# Patient Record
Sex: Male | Born: 1960 | Race: White | Hispanic: No | Marital: Married | Smoking: Current every day smoker
Health system: Southern US, Community
[De-identification: ages and names within clinical notes are randomized; demographics above are authoritative.]

## PROBLEM LIST (undated history)

## (undated) DIAGNOSIS — F419 Anxiety disorder, unspecified: Secondary | ICD-10-CM

## (undated) DIAGNOSIS — Z86010 Personal history of colonic polyps: Secondary | ICD-10-CM

## (undated) DIAGNOSIS — I10 Essential (primary) hypertension: Secondary | ICD-10-CM

## (undated) DIAGNOSIS — E785 Hyperlipidemia, unspecified: Secondary | ICD-10-CM

## (undated) HISTORY — DX: Personal history of colonic polyps: Z86.010

## (undated) HISTORY — PX: APPENDECTOMY: SHX54

## (undated) HISTORY — DX: Essential (primary) hypertension: I10

## (undated) HISTORY — DX: Hyperlipidemia, unspecified: E78.5

## (undated) HISTORY — DX: Anxiety disorder, unspecified: F41.9

## (undated) HISTORY — PX: VASECTOMY: SHX75

---

## 2009-01-26 ENCOUNTER — Ambulatory Visit: Payer: Self-pay | Admitting: Family Medicine

## 2009-01-26 DIAGNOSIS — I10 Essential (primary) hypertension: Secondary | ICD-10-CM | POA: Insufficient documentation

## 2009-01-26 DIAGNOSIS — E785 Hyperlipidemia, unspecified: Secondary | ICD-10-CM | POA: Insufficient documentation

## 2009-01-26 DIAGNOSIS — F17200 Nicotine dependence, unspecified, uncomplicated: Secondary | ICD-10-CM | POA: Insufficient documentation

## 2009-01-27 ENCOUNTER — Encounter (INDEPENDENT_AMBULATORY_CARE_PROVIDER_SITE_OTHER): Payer: Self-pay | Admitting: *Deleted

## 2009-01-27 LAB — CONVERTED CEMR LAB
ALT: 15 units/L (ref 0–53)
AST: 17 units/L (ref 0–37)
Albumin: 4.4 g/dL (ref 3.5–5.2)
Alkaline Phosphatase: 43 units/L (ref 39–117)
BUN: 20 mg/dL (ref 6–23)
Bilirubin, Direct: 0 mg/dL (ref 0.0–0.3)
CO2: 26 meq/L (ref 19–32)
Calcium: 9.8 mg/dL (ref 8.4–10.5)
Chloride: 107 meq/L (ref 96–112)
Cholesterol: 168 mg/dL (ref 0–200)
Creatinine, Ser: 1.3 mg/dL (ref 0.4–1.5)
Direct LDL: 127.3 mg/dL
GFR calc non Af Amer: 62.14 mL/min (ref >60)
Glucose, Bld: 98 mg/dL (ref 70–99)
HDL: 28.9 mg/dL — ABNORMAL LOW (ref >39.00)
Potassium: 4.3 meq/L (ref 3.5–5.1)
Sodium: 139 meq/L (ref 135–145)
Total Bilirubin: 0.9 mg/dL (ref 0.3–1.2)
Total CHOL/HDL Ratio: 6
Total Protein: 7.5 g/dL (ref 6.0–8.3)
Triglycerides: 213 mg/dL — ABNORMAL HIGH (ref 0.0–149.0)
VLDL: 42.6 mg/dL — ABNORMAL HIGH (ref 0.0–40.0)

## 2009-01-28 ENCOUNTER — Telehealth (INDEPENDENT_AMBULATORY_CARE_PROVIDER_SITE_OTHER): Payer: Self-pay | Admitting: *Deleted

## 2009-09-02 ENCOUNTER — Telehealth (INDEPENDENT_AMBULATORY_CARE_PROVIDER_SITE_OTHER): Payer: Self-pay | Admitting: *Deleted

## 2009-09-10 ENCOUNTER — Ambulatory Visit: Payer: Self-pay | Admitting: Family Medicine

## 2009-09-10 DIAGNOSIS — L723 Sebaceous cyst: Secondary | ICD-10-CM

## 2009-09-13 LAB — CONVERTED CEMR LAB
ALT: 18 units/L (ref 0–53)
AST: 21 units/L (ref 0–37)
Albumin: 4.6 g/dL (ref 3.5–5.2)
Alkaline Phosphatase: 48 units/L (ref 39–117)
BUN: 16 mg/dL (ref 6–23)
Basophils Absolute: 0.1 10*3/uL (ref 0.0–0.1)
Basophils Relative: 0.6 % (ref 0.0–3.0)
Bilirubin, Direct: 0.1 mg/dL (ref 0.0–0.3)
CO2: 25 meq/L (ref 19–32)
Calcium: 9.6 mg/dL (ref 8.4–10.5)
Chloride: 106 meq/L (ref 96–112)
Cholesterol: 173 mg/dL (ref 0–200)
Creatinine, Ser: 1 mg/dL (ref 0.4–1.5)
Eosinophils Absolute: 0.3 10*3/uL (ref 0.0–0.7)
Eosinophils Relative: 2.9 % (ref 0.0–5.0)
GFR calc non Af Amer: 80.18 mL/min (ref >60)
Glucose, Bld: 105 mg/dL — ABNORMAL HIGH (ref 70–99)
HCT: 44 % (ref 39.0–52.0)
HDL: 34.1 mg/dL — ABNORMAL LOW (ref >39.00)
Hemoglobin: 15.3 g/dL (ref 13.0–17.0)
LDL Cholesterol: 104 mg/dL — ABNORMAL HIGH (ref 0–99)
Lymphocytes Relative: 32.3 % (ref 12.0–46.0)
Lymphs Abs: 3.4 10*3/uL (ref 0.7–4.0)
MCHC: 34.7 g/dL (ref 30.0–36.0)
MCV: 91.1 fL (ref 78.0–100.0)
Monocytes Absolute: 1 10*3/uL (ref 0.1–1.0)
Monocytes Relative: 9.8 % (ref 3.0–12.0)
Neutro Abs: 5.8 10*3/uL (ref 1.4–7.7)
Neutrophils Relative %: 54.4 % (ref 43.0–77.0)
PSA: 0.53 ng/mL (ref 0.10–4.00)
Platelets: 294 10*3/uL (ref 150.0–400.0)
Potassium: 4.8 meq/L (ref 3.5–5.1)
RBC: 4.83 M/uL (ref 4.22–5.81)
RDW: 13 % (ref 11.5–14.6)
Sodium: 141 meq/L (ref 135–145)
TSH: 1.86 microintl units/mL (ref 0.35–5.50)
Total Bilirubin: 0.9 mg/dL (ref 0.3–1.2)
Total CHOL/HDL Ratio: 5
Total Protein: 7.3 g/dL (ref 6.0–8.3)
Triglycerides: 174 mg/dL — ABNORMAL HIGH (ref 0.0–149.0)
VLDL: 34.8 mg/dL (ref 0.0–40.0)
WBC: 10.6 10*3/uL — ABNORMAL HIGH (ref 4.5–10.5)

## 2009-10-01 ENCOUNTER — Ambulatory Visit: Payer: Self-pay | Admitting: Family Medicine

## 2009-10-08 ENCOUNTER — Encounter (INDEPENDENT_AMBULATORY_CARE_PROVIDER_SITE_OTHER): Payer: Self-pay | Admitting: *Deleted

## 2009-10-12 ENCOUNTER — Ambulatory Visit: Payer: Self-pay | Admitting: Internal Medicine

## 2009-10-22 ENCOUNTER — Ambulatory Visit: Payer: Self-pay | Admitting: Internal Medicine

## 2009-10-22 DIAGNOSIS — Z860100 Personal history of colon polyps, unspecified: Secondary | ICD-10-CM | POA: Insufficient documentation

## 2009-10-22 LAB — HM COLONOSCOPY

## 2009-10-26 ENCOUNTER — Ambulatory Visit: Payer: Self-pay | Admitting: Family Medicine

## 2009-10-28 ENCOUNTER — Encounter: Payer: Self-pay | Admitting: Internal Medicine

## 2010-04-29 ENCOUNTER — Ambulatory Visit
Admission: RE | Admit: 2010-04-29 | Discharge: 2010-04-29 | Payer: Self-pay | Source: Home / Self Care | Attending: Family Medicine | Admitting: Family Medicine

## 2010-04-29 ENCOUNTER — Other Ambulatory Visit: Payer: Self-pay | Admitting: Family Medicine

## 2010-04-29 LAB — BASIC METABOLIC PANEL
BUN: 19 mg/dL (ref 6–23)
CO2: 26 mEq/L (ref 19–32)
Calcium: 9.7 mg/dL (ref 8.4–10.5)
Chloride: 105 mEq/L (ref 96–112)
Creatinine, Ser: 1.1 mg/dL (ref 0.4–1.5)
GFR: 78.24 mL/min (ref >60.00)
Glucose, Bld: 84 mg/dL (ref 70–99)
Potassium: 4.1 mEq/L (ref 3.5–5.1)
Sodium: 143 mEq/L (ref 135–145)

## 2010-04-29 LAB — HEPATIC FUNCTION PANEL
ALT: 18 U/L (ref 0–53)
AST: 22 U/L (ref 0–37)
Albumin: 4.4 g/dL (ref 3.5–5.2)
Alkaline Phosphatase: 51 U/L (ref 39–117)
Bilirubin, Direct: 0.1 mg/dL (ref 0.0–0.3)
Total Bilirubin: 0.8 mg/dL (ref 0.3–1.2)
Total Protein: 7.2 g/dL (ref 6.0–8.3)

## 2010-04-29 LAB — LIPID PANEL
Cholesterol: 193 mg/dL (ref 0–200)
HDL: 33.4 mg/dL — ABNORMAL LOW (ref >39.00)
LDL Cholesterol: 128 mg/dL — ABNORMAL HIGH (ref 0–99)
Total CHOL/HDL Ratio: 6
Triglycerides: 156 mg/dL — ABNORMAL HIGH (ref 0.0–149.0)
VLDL: 31.2 mg/dL (ref 0.0–40.0)

## 2010-05-24 NOTE — Assessment & Plan Note (Signed)
Summary: cpx/kdc   Vital Signs:  Patient profile:   50 year old male Height:      69.75 inches Weight:      243 pounds BMI:     35.24 Pulse rate:   68 / minute BP sitting:   114 / 78  (left arm)  Vitals Entered By: Doristine Devoid (Sep 10, 2009 8:18 AM) CC: CPX AND LABS    History of Present Illness: 50 yo man here today for CPE.    1) R lateral thigh parasthesia- sxs started 2 yrs ago, constant.  had birthmark removed from area at same time sxs started.  2) ? cyst on back of head- has been present for 10 yrs, slowly growing.  no drainage, no pain.  Preventive Screening-Counseling & Management  Alcohol-Tobacco     Alcohol drinks/day: <1     Smoking Status: current     Smoking Cessation Counseling: yes     Smoke Cessation Stage: contemplative     Packs/Day: 0.75  Caffeine-Diet-Exercise     Does Patient Exercise: yes     Type of exercise: stationary bike, weights      Sexual History:  currently monogamous.        Drug Use:  never.    Current Medications (verified): 1)  Fenofibrate 160 Mg Tabs (Fenofibrate) .... Take One Tablet Daily 2)  Pravastatin Sodium 80 Mg Tabs (Pravastatin Sodium) .... Take One Tablet Daily 3)  Prevacid 24hr 15 Mg Cpdr (Lansoprazole) .... Take One Tablet Daily 4)  Hydrochlorothiazide 25 Mg  Tabs (Hydrochlorothiazide) .... Take 1 Tab By Mouth Every Morning  Allergies (verified): No Known Drug Allergies  Past History:  Past Medical History: Last updated: 01/26/2009 Hyperlipidemia Hypertension  Past Surgical History: Last updated: 01/26/2009 Appendectomy  Family History: Last updated: 01/26/2009 CAD-no HTN-no DM-paternal grandfather STROKE-no COLON CA-no PROSTATE CA-no  Social History: Last updated: 01/26/2009 married son (87) works in Education officer, environmental  [employer]       The patient complains of prolonged cough.  The patient denies anorexia, fever, weight loss, weight gain, vision loss, decreased hearing, hoarseness, chest  pain, syncope, dyspnea on exertion, peripheral edema, headaches, abdominal pain, melena, hematochezia, severe indigestion/heartburn, hematuria, suspicious skin lesions, depression, abnormal bleeding, enlarged lymph nodes, and testicular masses.    Physical Exam  General:  Overwt, well-developed,well-nourished,in no acute distress; alert,appropriate and cooperative throughout examination Head:  Normocephalic and atraumatic without obvious abnormalities. No apparent alopecia or balding.  small sebaceous cyst behind R ear Eyes:  No corneal or conjunctival inflammation noted. EOMI. Perrla. Funduscopic exam benign, without hemorrhages, exudates or papilledema. Vision grossly normal. Ears:  External ear exam shows no significant lesions or deformities.  Otoscopic examination reveals clear canals, tympanic membranes are intact bilaterally without bulging, retraction, inflammation or discharge. Hearing is grossly normal bilaterally. Nose:  External nasal examination shows no deformity or inflammation. Nasal mucosa are pink and moist without lesions or exudates. Mouth:  Oral mucosa and oropharynx without lesions or exudates.  Teeth in good repair. Neck:  No deformities, masses, or tenderness noted. Lungs:  Normal respiratory effort, chest expands symmetrically. Lungs are clear to auscultation, no crackles or wheezes. Heart:  Normal rate and regular rhythm. S1 and S2 normal without gallop, murmur, click, rub or other extra sounds. Abdomen:  soft, NT/ND, +BS Rectal:  No external abnormalities noted. Normal sphincter tone. No rectal masses or tenderness. Genitalia:  Testes bilaterally descended without nodularity, tenderness or masses. No scrotal masses or lesions. No penis lesions or urethral discharge.  Prostate:  Prostate gland firm and smooth, no enlargement, nodularity, tenderness, mass, asymmetry or induration. Msk:  No deformity or scoliosis noted of thoracic or lumbar spine.   Pulses:  +2 carotid,  radial, DP Extremities:  No clubbing, cyanosis, edema, or deformity noted with normal full range of motion of all joints.   Neurologic:  No cranial nerve deficits noted. Station and gait are normal. Plantar reflexes are down-going bilaterally. DTRs are symmetrical throughout. Sensory, motor and coordinative functions appear intact. Skin:  Intact without suspicious lesions or rashes Cervical Nodes:  No lymphadenopathy noted Inguinal Nodes:  No significant adenopathy Psych:  Cognition and judgment appear intact. Alert and cooperative with normal attention span and concentration. No apparent delusions, illusions, hallucinations   Impression & Recommendations:  Problem # 1:  PHYSICAL EXAMINATION (ICD-V70.0) Assessment New pt's PE WNL.  baseline EKG obtained.  pt now needs routine colonoscopy.  check labs.  anticipatory guidance provided. Orders: TLB-CBC Platelet - w/Differential (85025-CBCD) TLB-TSH (Thyroid Stimulating Hormone) (84443-TSH) TLB-PSA (Prostate Specific Antigen) (84153-PSA) Gastroenterology Referral (GI)  Problem # 2:  TOBACCO USE (ICD-305.1) Assessment: Unchanged encouraged pt to quit.  get baseline CXR Orders: T-2 View CXR (71020TC)  Problem # 3:  HYPERTENSION (ICD-401.9) Assessment: Unchanged BP excellent today.  Lisinopril is causing cough- pt would prefer to change meds.  given control will attempt to manage BP on just HCTZ.  pt in agreement. The following medications were removed from the medication list:    Lisinopril-hydrochlorothiazide 20-25 Mg Tabs (Lisinopril-hydrochlorothiazide) .Marland Kitchen... 1 by mouth once daily His updated medication list for this problem includes:    Hydrochlorothiazide 25 Mg Tabs (Hydrochlorothiazide) .Marland Kitchen... Take 1 tab by mouth every morning  Orders: TLB-BMP (Basic Metabolic Panel-BMET) (80048-METABOL)  Problem # 4:  HYPERLIPIDEMIA (ICD-272.4) Assessment: Unchanged due for labs today.  no problems tolerating meds. His updated medication list  for this problem includes:    Fenofibrate 160 Mg Tabs (Fenofibrate) .Marland Kitchen... Take one tablet daily    Pravastatin Sodium 80 Mg Tabs (Pravastatin sodium) .Marland Kitchen... Take one tablet daily  Orders: Venipuncture ([address]) TLB-Lipid Panel (80061-LIPID) TLB-Hepatic/Liver Function Pnl (80076-HEPATIC)  Problem # 5:  SEBACEOUS CYST, SCALP (ICD-706.2) Assessment: New no pain, not infected.  will continue to monitor.  Complete Medication List: 1)  Fenofibrate 160 Mg Tabs (Fenofibrate) .... Take one tablet daily 2)  Pravastatin Sodium 80 Mg Tabs (Pravastatin sodium) .... Take one tablet daily 3)  Prevacid 24hr 15 Mg Cpdr (Lansoprazole) .... Take one tablet daily 4)  Hydrochlorothiazide 25 Mg Tabs (Hydrochlorothiazide) .... Take 1 tab by mouth every morning  Patient Instructions: 1)  Schedule a nurse visit in 3-4 weeks to recheck BP 2)  Please schedule a follow-up appointment in 6 months to recheck blood pressure and cholesterol.  3)  Take the hydrochlorothiazide as directed- once daily 4)  Keep up the good work on diet and exercise 5)  STOP SMOKING! 6)  Go get your chest xray at [address] at your convenience 7)  Someone will call you with your GI appt 8)  We can monitor your cyst- if it grows, becomes painful or drains- let me know 9)  Have a great summer!! Prescriptions: HYDROCHLOROTHIAZIDE 25 MG  TABS (HYDROCHLOROTHIAZIDE) Take 1 tab by mouth every morning  #30 x 3   Entered and Authorized by:   Neena Rhymes MD   Signed by:   Neena Rhymes MD on 09/10/2009   Method used:   [facility]        [facility]  [place] (retail[facility]       [address]       [place], [place]  [address]       Ph: [phone]       Fax: [phone]   RxID:   [id number]   Appended Document: cpx/kdc    Clinical Lists Changes  Orders: Added new Service order of EKG w/ Interpretation (93000) - Signed

## 2010-05-24 NOTE — Assessment & Plan Note (Signed)
Summary: bp check//kn   Vital Signs:  Patient profile:   50 year old male Weight:      245 pounds Pulse rate:   84 / minute BP sitting:   118 / 72  (left arm)  Vitals Entered By: Doristine Devoid (October 26, 2009 10:54 AM) CC: bp check  Comments patient's bp has been average of 120/80's    Allergies: No Known Drug Allergies   Complete Medication List: 1)  Fenofibrate 160 Mg Tabs (Fenofibrate) .... Take one tablet daily 2)  Pravastatin Sodium 80 Mg Tabs (Pravastatin sodium) .... Take one tablet daily 3)  Prevacid 24hr 15 Mg Cpdr (Lansoprazole) .... Take one tablet daily 4)  Hydrochlorothiazide 25 Mg Tabs (Hydrochlorothiazide) .... Take 1 tab by mouth every morning 5)  Amlodipine Besylate 5 Mg Tabs (Amlodipine besylate) .Marland Kitchen.. 1 by mouth every day 6)  Moviprep 100 Gm Solr (Peg-kcl-nacl-nasulf-na asc-c) .... As per prep instructions.

## 2010-05-24 NOTE — Progress Notes (Signed)
Summary: Problems with RX  Phone Note Call from Patient Call back at Home Phone [phone]   Caller: Patient Call For: Walk-In Reason for Call: Refill Medication Summary of Call: Patient came in to ask about his lisnopril rx. He said that the pharmacy kept telling him that we were denying because we still thought he was on the diovan. Patient said he tryed to Diovan for a couple of days and didnt like it. He went back to the lisnopril and is dealing with the cough and using cough drops. The pharmacy has been refilling off of his latest rx refill until it ran out. He now needed a new rx for the lisonopril. I spoke with Alida and she sent the new rx over to his pharmacy. He has a CPX scheduled with Dr. Beverely Low on 5.20.11 and can discuss the medications further then. Initial call taken by: Harold Barban,  Sep 02, 2009 11:29 AM    New/Updated Medications: LISINOPRIL-HYDROCHLOROTHIAZIDE 20-25 MG TABS (LISINOPRIL-HYDROCHLOROTHIAZIDE) 1 by mouth once daily Prescriptions: LISINOPRIL-HYDROCHLOROTHIAZIDE 20-25 MG TABS (LISINOPRIL-HYDROCHLOROTHIAZIDE) 1 by mouth once daily  #30 x 0   Entered by:   Kandice Hams   Authorized by:   Neena Rhymes MD   Signed by:   Kandice Hams on 09/02/2009   Method used:   Faxed to ...       [facility] [phone]* (retail)       [address]       [place], [place]  [address]       Ph: [phone]       Fax: [phone]   RxID:   [id number]

## 2010-05-24 NOTE — Procedures (Signed)
Summary: Colonoscopy  Patient: [name] Note: All result statuses are Final unless otherwise noted.  Tests: (1) Colonoscopy (COL)   COL Colonoscopy           DONE     [facility]     [address]     [place], [place]  [address]           COLONOSCOPY PROCEDURE REPORT           PATIENT:  Thomas Proctor, Thomas Proctor  MR#:  [id number]     BIRTHDATE:  07-18-1960, 48 yrs. old  GENDER:  male     ENDOSCOPIST:  Iva Boop, MD, [facility]     REF. BY:  Helane Rima. Beverely Low, M.D.     PROCEDURE DATE:  10/22/2009     PROCEDURE:  Colonoscopy with biopsy and snare polypectomy     ASA CLASS:  Class II     INDICATIONS:  Routine Risk Screening     MEDICATIONS:   Fentanyl 75 mcg IV, Versed 8 mg IV           DESCRIPTION OF PROCEDURE:   After the risks benefits and     alternatives of the procedure were thoroughly explained, informed     consent was obtained.  Digital rectal exam was performed and     revealed no abnormalities and normal prostate.   The LB CF-[id number]     [id number] endoscope was introduced through the anus and advanced to     the cecum, which was identified by both the appendix and ileocecal     valve, without limitations.  The quality of the prep was     excellent, using MoviPrep.  The instrument was then slowly     withdrawn as the colon was fully examined.     Insertion: 2:58 minutes Withdrawal: 13:35 minutes     <<PROCEDUREIMAGES>>           FINDINGS:  Three polyps were found. They were diminutive. 2mm     (transverse), 4mm (transverse) and 5 mm (rectal) polyps The 2mm     polyp was removed using cold biopsy forceps. The other polyps were     snared without cautery. Retrieval was successful. Mild     diverticulosis was found in the sigmoid colon.  This was otherwise     a normal examination of the colon.   Retroflexed views in the     rectum revealed no abnormalities.    The scope was then withdrawn     from the patient and the procedure completed.           COMPLICATIONS:   None     ENDOSCOPIC IMPRESSION:     1) Three polyps removed, maximum-size 5 mm.     2) Mild diverticulosis in the sigmoid colon     3) Normal colonoscopy otherwise with excellent prep           REPEAT EXAM:  In for Colonoscopy, pending biopsy results.           Iva Boop, MD, [facility]           CC:  Sheliah Hatch, MD     The Patient           n.     Rosalie Doctor:   Iva Boop at 10/22/2009 12:14 PM           Velora Mediate, [id number]  Note: An exclamation mark (!) indicates a result that was not dispersed into  the flowsheet. Document Creation Date: 10/22/2009 12:15 PM _______________________________________________________________________  (1) Order result status: Final Collection or observation date-time: 10/22/2009 12:05 Requested date-time:  Receipt date-time:  Reported date-time:  Referring Physician:   Ordering Physician: Stan Head [phone]) Specimen Source:  Source: Launa Grill Order Number: [id number] Lab site:   Appended Document: Colonoscopy     Procedures Next Due Date:    Colonoscopy: 10/2012

## 2010-05-24 NOTE — Miscellaneous (Signed)
Summary: previsit/rm  Clinical Lists Changes  Medications: Added new medication of MOVIPREP 100 GM  SOLR (PEG-KCL-NACL-NASULF-NA ASC-C) As per prep instructions. - Signed Rx of MOVIPREP 100 GM  SOLR (PEG-KCL-NACL-NASULF-NA ASC-C) As per prep instructions.;  #1 x 0;  Signed;  Entered by: Sherren Kerns RN;  Authorized by: Iva Boop MD, [facility];  Method used: [facility] to [facility] [phone]*, [address], [place], [place], [place]  [address], Ph: [phone], Fax: [phone] Observations: Added new observation of ALLERGY REV: Done (10/12/2009 10:47)    Prescriptions: MOVIPREP 100 GM  SOLR (PEG-KCL-NACL-NASULF-NA ASC-C) As per prep instructions.  #1 x 0   Entered by:   Sherren Kerns RN   Authorized by:   Iva Boop MD, [facility]   Signed by:   Sherren Kerns RN on 10/12/2009   Method used:   Electronically to        [facility] [phone]* (retail)       [address]       [place], [place]  [address]       Ph: [phone]       Fax: [phone]   RxID:   [id number]

## 2010-05-24 NOTE — Assessment & Plan Note (Signed)
Summary: bp check/cbs   Vital Signs:  Patient profile:   50 year old male Weight:      248 pounds Pulse rate:   88 / minute BP sitting:   132 / 90  (left arm)  Vitals Entered By: Doristine Devoid (October 01, 2009 9:14 AM) CC: bp check    History of Present Illness: 50 yo man here today to f/u BP since stopping Lisinopril.  BPs at home in 130-140s since stopping med.  no CP, SOB, HAs, visual changes, edema.  stopped coughing when meds stopped.  Preventive Screening-Counseling & Management  Alcohol-Tobacco     Smoking Status: current     Smoking Cessation Counseling: yes     Smoke Cessation Stage: contemplative  Allergies (verified): No Known Drug Allergies  Past History:  Past Medical History: Last updated: 01/26/2009 Hyperlipidemia Hypertension  Review of Systems      See HPI  Physical Exam  General:  Overwt, well-developed,well-nourished,in no acute distress; alert,appropriate and cooperative throughout examination Neck:  No deformities, masses, or tenderness noted. Lungs:  Normal respiratory effort, chest expands symmetrically. Lungs are clear to auscultation, no crackles or wheezes. Heart:  Normal rate and regular rhythm. S1 and S2 normal without gallop, murmur, click, rub or other extra sounds. Pulses:  +2 carotid, radial, DP Extremities:  No clubbing, cyanosis, edema, or deformity noted with normal full range of motion of all joints.     Impression & Recommendations:  Problem # 1:  HYPERTENSION (ICD-401.9) Assessment Unchanged BP not at goal on just HCTZ.  add Amlodipine.  recheck BP in 3-4 weeks. His updated medication list for this problem includes:    Hydrochlorothiazide 25 Mg Tabs (Hydrochlorothiazide) .Marland Kitchen... Take 1 tab by mouth every morning    Amlodipine Besylate 5 Mg Tabs (Amlodipine besylate) .Marland Kitchen... 1 by mouth every day  Complete Medication List: 1)  Fenofibrate 160 Mg Tabs (Fenofibrate) .... Take one tablet daily 2)  Pravastatin Sodium 80 Mg Tabs  (Pravastatin sodium) .... Take one tablet daily 3)  Prevacid 24hr 15 Mg Cpdr (Lansoprazole) .... Take one tablet daily 4)  Hydrochlorothiazide 25 Mg Tabs (Hydrochlorothiazide) .... Take 1 tab by mouth every morning 5)  Amlodipine Besylate 5 Mg Tabs (Amlodipine besylate) .Marland Kitchen.. 1 by mouth every day  Patient Instructions: 1)  Please schedule a follow-up appointment in 3-4 weeks for a nurse visit to check BP. 2)  Start the Amlodipine daily 3)  Call with any questions or concerns 4)  Have a great summer!  Prescriptions: AMLODIPINE BESYLATE 5 MG  TABS (AMLODIPINE BESYLATE) 1 by mouth every day  #30 x 3   Entered and Authorized by:   Neena Rhymes MD   Signed by:   Neena Rhymes MD on 10/01/2009   Method used:   Electronically to        [facility] [phone]* (retail)       [address]       [place], [place]  [address]       Ph: [phone]       Fax: [phone]   RxID:   [id number]

## 2010-05-24 NOTE — Letter (Signed)
Summary: Patient Notice- Polyp Results  [facility]  [address] [place], [place] [address]   Phone: [phone]  Fax: [phone]        October 28, 2009 MRN: [id number]    Thomas Proctor [address] [place], [place]  [address]    Dear [name],  The polyps removed from your colon were adenomatous. This means that they were pre-cancerous or that  they had the potential to change into cancer over time.   I recommend that you have a repeat colonoscopy in 3 years to determine if you have developed any new polyps over time. If you develop any new rectal bleeding, abdominal pain or significant bowel habit changes, please contact us before then.  Please call us if you are having persistent problems or have questions about your condition that have not been fully answered at this time.  Sincerely,  Iva Boop MD, [facility]  This letter has been electronically signed by your physician.  Appended Document: Patient Notice- Polyp Results letter mailed

## 2010-05-24 NOTE — Letter (Signed)
Summary: [facility] Instructions  [facility]  [address] [place], [place] [address]   Phone: [phone]  Fax: [phone]       Thomas Proctor    06-19-1960    MRN: [id number]        Procedure Day Dorna Bloom:  Farrell Ours  10/22/09     Arrival Time: 10:30am     Procedure Time: 11:30am     Location of Procedure:                    _X _  [facility] (4th Floor)                        PREPARATION FOR COLONOSCOPY WITH MOVIPREP   Starting 5 days prior to your procedure  SUNDAY 06/26 do not eat nuts, seeds, popcorn, corn, beans, peas,  salads, or any raw vegetables.  Do not take any fiber supplements (e.g. Metamucil, Citrucel, and Benefiber).  THE DAY BEFORE YOUR PROCEDURE         DATE: THURSDAY  06/30  1.  Drink clear liquids the entire day-NO SOLID FOOD  2.  Do not drink anything colored red or purple.  Avoid juices with pulp.  No orange juice.  3.  Drink at least 64 oz. (8 glasses) of fluid/clear liquids during the day to prevent dehydration and help the prep work efficiently.  CLEAR LIQUIDS INCLUDE: Water Jello Ice Popsicles Tea (sugar ok, no milk/cream) Powdered fruit flavored drinks Coffee (sugar ok, no milk/cream) Gatorade Juice: apple, white grape, white cranberry  Lemonade Clear bullion, consomm, broth Carbonated beverages (any kind) Strained chicken noodle soup Hard Candy                             4.  In the morning, mix first dose of MoviPrep solution:    Empty 1 Pouch A and 1 Pouch B into the disposable container    Add lukewarm drinking water to the top line of the container. Mix to dissolve    Refrigerate (mixed solution should be used within 24 hrs)  5.  Begin drinking the prep at 5:00 p.m. The MoviPrep container is divided by 4 marks.   Every 15 minutes drink the solution down to the next mark (approximately 8 oz) until the full liter is complete.   6.  Follow completed prep with 16 oz of clear liquid of your choice  (Nothing red or purple).  Continue to drink clear liquids until bedtime.  7.  Before going to bed, mix second dose of MoviPrep solution:    Empty 1 Pouch A and 1 Pouch B into the disposable container    Add lukewarm drinking water to the top line of the container. Mix to dissolve    Refrigerate  THE DAY OF YOUR PROCEDURE      DATE:  FRIDAY  07/01  Beginning at  6:30 a.m. (5 hours before procedure):         1. Every 15 minutes, drink the solution down to the next mark (approx 8 oz) until the full liter is complete.  2. Follow completed prep with 16 oz. of clear liquid of your choice.    3. You may drink clear liquids until 9:30am (2 HOURS BEFORE PROCEDURE).   MEDICATION INSTRUCTIONS  Unless otherwise instructed, you should take regular prescription medications with a small sip of water   as early as possible the  morning of your procedure.   Additional medication instructions:  Hold HCTZ pill morning of procedure.         OTHER INSTRUCTIONS  You will need a responsible adult at least 52 years of age to accompany you and drive you home.   This person must remain in the waiting room during your procedure.  Wear loose fitting clothing that is easily removed.  Leave jewelry and other valuables at home.  However, you may wish to bring a book to read or  an iPod/MP3 player to listen to music as you wait for your procedure to start.  Remove all body piercing jewelry and leave at home.  Total time from sign-in until discharge is approximately 2-3 hours.  You should go home directly after your procedure and rest.  You can resume normal activities the  day after your procedure.  The day of your procedure you should not:   Drive   Make legal decisions   Operate machinery   Drink alcohol   Return to work  You will receive specific instructions about eating, activities and medications before you leave.    The above instructions have been reviewed and explained to me  by  Sherren Kerns RN  October 12, 2009 11:38 AM      I fully understand and can verbalize these instructions _____________________________ Date _________

## 2010-05-26 NOTE — Assessment & Plan Note (Signed)
Summary: bp check& lab// cbs   Vital Signs:  Patient profile:   50 year old male Weight:      241 pounds BMI:     34.95 Pulse rate:   95 / minute BP sitting:   110 / 80  (left arm)  Vitals Entered By: Doristine Devoid CMA (April 29, 2010 9:43 AM) CC: bp f/u and labs   History of Present Illness: 50 yo man here today for  1) HTN- BP is excellent today.  no CP, SOB, HAs, visual changes, edema.  on amlodipine and HCTZ.  2) Hyperlipidemia- on pravastatin and fenofibrate.  no N/V, abd pain, myalgias.  3) tobacco abuse- pt had quit smoking for 7 days before his temper became a problem.  now smoking 3/4 pack daily.  wants to quit.  wife and son also smoke.  Preventive Screening-Counseling & Management  Alcohol-Tobacco     Smoking Status: current     Smoking Cessation Counseling: yes     Packs/Day: 0.75  Current Medications (verified): 1)  Fenofibrate 160 Mg Tabs (Fenofibrate) .... Take One Tablet Daily 2)  Pravastatin Sodium 80 Mg Tabs (Pravastatin Sodium) .... Take One Tablet Daily 3)  Prevacid 24hr 15 Mg Cpdr (Lansoprazole) .... Take One Tablet Daily 4)  Hydrochlorothiazide 25 Mg  Tabs (Hydrochlorothiazide) .... Take 1 Tab By Mouth Every Morning 5)  Amlodipine Besylate 5 Mg  Tabs (Amlodipine Besylate) .Marland Kitchen.. 1 By Mouth Every Day  Allergies (verified): No Known Drug Allergies  Past History:  Past medical, surgical, family and social histories (including risk factors) reviewed, and no changes noted (except as noted below).  Past Medical History: Reviewed history from 01/26/2009 and no changes required. Hyperlipidemia Hypertension  Past Surgical History: Reviewed history from 01/26/2009 and no changes required. Appendectomy  Family History: Reviewed history from 01/26/2009 and no changes required. CAD-no HTN-no DM-paternal grandfather STROKE-no COLON CA-no PROSTATE CA-no  Social History: Reviewed history from 01/26/2009 and no changes required. married son  (87) works in Education officer, environmental  Review of Systems      See HPI  Physical Exam  General:  Overwt, well-developed,well-nourished,in no acute distress; alert,appropriate and cooperative throughout examination Head:  Normocephalic and atraumatic without obvious abnormalities Neck:  No deformities, masses, or tenderness noted. Lungs:  Normal respiratory effort, chest expands symmetrically. Lungs are clear to auscultation, no crackles or wheezes. Heart:  Normal rate and regular rhythm. S1 and S2 normal without gallop, murmur, click, rub or other extra sounds. Abdomen:  soft, NT/ND, +BS Pulses:  +2 carotid, radial, DP Extremities:  No clubbing, cyanosis, edema, or deformity noted     Impression & Recommendations:  Problem # 1:  HYPERTENSION (ICD-401.9) Assessment Unchanged BP excellent today.  asymptomatic.  no changes. His updated medication list for this problem includes:    Hydrochlorothiazide 25 Mg Tabs (Hydrochlorothiazide) .Marland Kitchen... Take 1 tab by mouth every morning    Amlodipine Besylate 5 Mg Tabs (Amlodipine besylate) .Marland Kitchen... 1 by mouth every day  Orders: Venipuncture (04540) TLB-BMP (Basic Metabolic Panel-BMET) (80048-METABOL)  Problem # 2:  HYPERLIPIDEMIA (ICD-272.4) Assessment: Unchanged due for labs.  adjust meds as needed. His updated medication list for this problem includes:    Fenofibrate 160 Mg Tabs (Fenofibrate) .Marland Kitchen... Take one tablet daily    Pravastatin Sodium 80 Mg Tabs (Pravastatin sodium) .Marland Kitchen... Take one tablet daily  Orders: TLB-Lipid Panel (80061-LIPID) TLB-Hepatic/Liver Function Pnl (80076-HEPATIC)  Problem # 3:  TOBACCO USE (ICD-305.1) Assessment: Unchanged discussed different options for quitting smoking- patches, gum, lozenges, welbutrin.  pt  would like to try on his own by decreasing the # daily.  talked about strategies for limiting # of cigs/day, limiting places he allows himself to smoke, and putting $ spent on cigarettes into a different fund to save money.  pt  excited about possibilities.  Complete Medication List: 1)  Fenofibrate 160 Mg Tabs (Fenofibrate) .... Take one tablet daily 2)  Pravastatin Sodium 80 Mg Tabs (Pravastatin sodium) .... Take one tablet daily 3)  Prevacid 24hr 15 Mg Cpdr (Lansoprazole) .... Take one tablet daily 4)  Hydrochlorothiazide 25 Mg Tabs (Hydrochlorothiazide) .... Take 1 tab by mouth every morning 5)  Amlodipine Besylate 5 Mg Tabs (Amlodipine besylate) .Marland Kitchen.. 1 by mouth every day  Patient Instructions: 1)  Schedule your complete physical in May- do not eat before this appt 2)  We'll notify you of your lab results 3)  Keep up the good work on diet and exercise- you look great! 4)  STOP SMOKING!  You can do this!!! 5)  Call with any questions or concerns 6)  Happy New Year! Prescriptions: PREVACID 24HR 15 MG CPDR (LANSOPRAZOLE) take one tablet daily  #30 x 6   Entered and Authorized by:   Neena Rhymes MD   Signed by:   Neena Rhymes MD on 04/29/2010   Method used:   [facility]        [facility] [phone][facility] (retail)       [address]       [place], [place]  [address]       Ph: [phone]       Fax: [phone]   RxID:   [id number] AMLODIPINE BESYLATE 5 MG  TABS (AMLODIPINE BESYLATE) 1 by mouth every day  #30 Tablet x 6   Entered and Authorized by:   Neena Rhymes MD   Signed by:   Neena Rhymes MD on 04/29/2010   Method used:   Electronically to        [facility] [phone]* (retail)       [address]       [place], [place]  [address]       Ph: [phone]       Fax: [phone]   RxID:   [id number] HYDROCHLOROTHIAZIDE 25 MG  TABS (HYDROCHLOROTHIAZIDE) Take 1 tab by mouth every morning  #30 Tablet x 6   Entered and Authorized by:   Neena Rhymes MD   Signed by:   Neena Rhymes MD on 04/29/2010   Method used:   Electronically to        [facility] [phone]* (retail)       [address]        [place], [place]  [address]       Ph: [phone]       Fax: [phone]   RxID:   [id number] PRAVASTATIN SODIUM 80 MG TABS (PRAVASTATIN SODIUM) take one tablet daily  #30 Tablet x 6   Entered and Authorized by:   Neena Rhymes MD   Signed by:   Neena Rhymes MD on 04/29/2010   Method used:   Electronically to        [facility] [phone]* (retail)       [address]       [place], [place]  [address]  Ph: [phone]       Fax: [phone]   RxID:   [id number] FENOFIBRATE 160 MG TABS (FENOFIBRATE) take one tablet daily  #30 Tablet x 6   Entered and Authorized by:   Neena Rhymes MD   Signed by:   Neena Rhymes MD on 04/29/2010   Method used:   Electronically to        [facility] [phone]* (retail)       [address]       [place], [place]  [address]       Ph: [phone]       Fax: [phone]   RxID:   [id number]    Orders Added: 1)  Venipuncture [18841] 2)  TLB-BMP (Basic Metabolic Panel-BMET) [80048-METABOL] 3)  TLB-Lipid Panel [80061-LIPID] 4)  TLB-Hepatic/Liver Function Pnl [80076-HEPATIC] 5)  Est. Patient Level IV [660[address]]  Appended Document: bp check& lab// cbs

## 2010-09-26 ENCOUNTER — Encounter: Payer: Self-pay | Admitting: Family Medicine

## 2010-11-10 ENCOUNTER — Encounter: Payer: Self-pay | Admitting: Family Medicine

## 2010-11-29 ENCOUNTER — Encounter: Payer: Self-pay | Admitting: Family Medicine

## 2010-12-16 ENCOUNTER — Other Ambulatory Visit: Payer: Self-pay | Admitting: Family Medicine

## 2010-12-29 ENCOUNTER — Ambulatory Visit (INDEPENDENT_AMBULATORY_CARE_PROVIDER_SITE_OTHER): Payer: BC Managed Care – PPO | Admitting: Family Medicine

## 2010-12-29 ENCOUNTER — Other Ambulatory Visit: Payer: Self-pay | Admitting: Family Medicine

## 2010-12-29 ENCOUNTER — Encounter: Payer: Self-pay | Admitting: Family Medicine

## 2010-12-29 DIAGNOSIS — N508 Other specified disorders of male genital organs: Secondary | ICD-10-CM

## 2010-12-29 DIAGNOSIS — E785 Hyperlipidemia, unspecified: Secondary | ICD-10-CM

## 2010-12-29 DIAGNOSIS — Z0000 Encounter for general adult medical examination without abnormal findings: Secondary | ICD-10-CM | POA: Insufficient documentation

## 2010-12-29 DIAGNOSIS — I10 Essential (primary) hypertension: Secondary | ICD-10-CM

## 2010-12-29 DIAGNOSIS — N5089 Other specified disorders of the male genital organs: Secondary | ICD-10-CM

## 2010-12-29 LAB — CBC WITH DIFFERENTIAL/PLATELET
Basophils Absolute: 0.1 10*3/uL (ref 0.0–0.1)
Basophils Relative: 0.8 % (ref 0.0–3.0)
Eosinophils Relative: 3.5 % (ref 0.0–5.0)
HCT: 46.3 % (ref 39.0–52.0)
Hemoglobin: 15.8 g/dL (ref 13.0–17.0)
Lymphocytes Relative: 30 % (ref 12.0–46.0)
Lymphs Abs: 3.2 10*3/uL (ref 0.7–4.0)
Monocytes Relative: 8.9 % (ref 3.0–12.0)
Neutro Abs: 6 10*3/uL (ref 1.4–7.7)
RBC: 5.03 Mil/uL (ref 4.22–5.81)
RDW: 13.4 % (ref 11.5–14.6)
WBC: 10.5 10*3/uL (ref 4.5–10.5)

## 2010-12-29 LAB — BASIC METABOLIC PANEL
CO2: 25 mEq/L (ref 19–32)
Chloride: 106 mEq/L (ref 96–112)
Creatinine, Ser: 0.8 mg/dL (ref 0.4–1.5)
Glucose, Bld: 111 mg/dL — ABNORMAL HIGH (ref 70–99)

## 2010-12-29 LAB — LIPID PANEL
Cholesterol: 199 mg/dL (ref 0–200)
HDL: 35.6 mg/dL — ABNORMAL LOW (ref >39.00)
Total CHOL/HDL Ratio: 6
Triglycerides: 221 mg/dL — ABNORMAL HIGH (ref 0.0–149.0)
VLDL: 44.2 mg/dL — ABNORMAL HIGH (ref 0.0–40.0)

## 2010-12-29 LAB — HEPATIC FUNCTION PANEL
ALT: 15 U/L (ref 0–53)
Albumin: 4.6 g/dL (ref 3.5–5.2)
Bilirubin, Direct: 0.2 mg/dL (ref 0.0–0.3)
Total Protein: 7.6 g/dL (ref 6.0–8.3)

## 2010-12-29 NOTE — Patient Instructions (Signed)
Follow up in 6 months to recheck blood pressure and cholesterol We'll call you with your ultrasound appt Try and stop smoking!!! We'll notify you of your lab results Call with any questions or concerns Have a great upcoming holiday season!!!!

## 2010-12-29 NOTE — Progress Notes (Signed)
  Subjective:    Patient ID: Thomas Proctor, male    DOB: 11-20-1960, 50 y.o.   MRN: [id number]  HPI CPE- UTD on colonoscopy.  No concerns today.   Review of Systems Patient reports no  vision/ hearing changes,anorexia, weight change, fever ,adenopathy, persistant / recurrent hoarseness, swallowing issues, chest pain,palpitations, edema,persistant / recurrent cough, hemoptysis, dyspnea(rest, exertional, paroxysmal nocturnal), gastrointestinal  bleeding (melena, rectal bleeding), abdominal pain, excessive heart burn, GU symptoms( dysuria, hematuria, pyuria, voiding/incontinence  Issues) syncope, focal weakness, memory loss,numbness & tingling, skin/hair/nail changes,depression, anxiety, abnormal bruising/bleeding, musculoskeletal symptoms/signs.     Objective:   Physical Exam BP 120/84  Ht 5\' 9"  (1.753 m)  Wt 246 lb 9.6 oz (111.857 kg)  BMI 36.42 kg/m2  General Appearance:    Alert, cooperative, no distress, appears stated age  Head:    Normocephalic, without obvious abnormality, atraumatic  Eyes:    PERRL, conjunctiva/corneas clear, EOM's intact, fundi    benign, both eyes       Ears:    Normal TM's and external ear canals, both ears  Nose:   Nares normal, septum midline, mucosa normal, no drainage   or sinus tenderness  Throat:   Lips, mucosa, and tongue normal; teeth and gums normal  Neck:   Supple, symmetrical, trachea midline, no adenopathy;       thyroid:  No enlargement/tenderness/nodules  Back:     Symmetric, no curvature, ROM normal, no CVA tenderness  Lungs:     Clear to auscultation bilaterally, respirations unlabored  Chest wall:    No tenderness or deformity  Heart:    Regular rate and rhythm, S1 and S2 normal, no murmur, rub   or gallop  Abdomen:     Soft, non-tender, bowel sounds active all four quadrants,    no masses, no organomegaly  Genitalia:    Normal male without lesion, discharge or tenderness.  1.5 cm firm, mobile nodule posterior to R testicle- nontender    Rectal:    Normal tone, normal prostate, no masses or tenderness;   Extremities:   Extremities normal, atraumatic, no cyanosis or edema  Pulses:   2+ and symmetric all extremities  Skin:   Skin color, texture, turgor normal, no rashes or lesions  Lymph nodes:   Cervical, supraclavicular, and axillary nodes normal  Neurologic:   CNII-XII intact. Normal strength, sensation and reflexes      throughout          Assessment & Plan:

## 2010-12-29 NOTE — Assessment & Plan Note (Signed)
Check labs.  Adjust meds prn  

## 2010-12-29 NOTE — Assessment & Plan Note (Signed)
Well controlled today.  Check labs.

## 2010-12-29 NOTE — Assessment & Plan Note (Signed)
Pt's PE WNL w/ exception of testicular mass (see below).  UTD on colonoscopy, check meds.  Anticipatory guidance provided- encouraged smoking cessation.

## 2010-12-29 NOTE — Assessment & Plan Note (Signed)
[place] to assess

## 2010-12-30 ENCOUNTER — Ambulatory Visit (HOSPITAL_BASED_OUTPATIENT_CLINIC_OR_DEPARTMENT_OTHER)
Admission: RE | Admit: 2010-12-30 | Discharge: 2010-12-30 | Disposition: A | Discharge status: Home / Self Care | Payer: BC Managed Care – PPO | Source: Ambulatory Visit | Attending: Family Medicine | Admitting: Family Medicine

## 2010-12-30 ENCOUNTER — Ambulatory Visit (INDEPENDENT_AMBULATORY_CARE_PROVIDER_SITE_OTHER)
Admission: RE | Admit: 2010-12-30 | Discharge: 2010-12-30 | Disposition: A | Discharge status: Home / Self Care | Payer: BC Managed Care – PPO | Source: Ambulatory Visit | Attending: Family Medicine | Admitting: Family Medicine

## 2010-12-30 DIAGNOSIS — N433 Hydrocele, unspecified: Secondary | ICD-10-CM | POA: Insufficient documentation

## 2010-12-30 DIAGNOSIS — N508 Other specified disorders of male genital organs: Secondary | ICD-10-CM

## 2010-12-30 DIAGNOSIS — N5089 Other specified disorders of the male genital organs: Secondary | ICD-10-CM

## 2011-01-02 ENCOUNTER — Telehealth: Payer: Self-pay

## 2011-01-02 NOTE — Telephone Encounter (Signed)
Message copied by Beverely Low on Mon Jan 02, 2011 10:48 AM ------      Message from: Sheliah Hatch      Created: Sun Jan 01, 2011  8:05 PM       Normal [place]- this is great news.  Please call and let him know

## 2011-01-02 NOTE — Telephone Encounter (Signed)
Left message to notify pt of US results 

## 2011-01-04 ENCOUNTER — Telehealth: Payer: Self-pay

## 2011-01-04 MED ORDER — ATORVASTATIN CALCIUM 20 MG PO TABS: 20.0000 mg | ORAL_TABLET | Freq: Every day | ORAL | Status: DC

## 2011-01-04 NOTE — Telephone Encounter (Signed)
Left message on personally identified voicemail to notify pt of lab results and Rx sent to pharmacy

## 2011-01-04 NOTE — Telephone Encounter (Signed)
Message copied by Beverely Low on Wed Jan 04, 2011  8:54 AM ------      Message from: Sheliah Hatch      Created: Sun Jan 01, 2011  8:13 PM       Sugar is elevated- putting him in the pre-diabetic range      LDL and triglycerides are both elevated- this will improve w/ attention to diet and exercise but would also recommend switching to Lipitor 20mg  nightly rather than Pravastatin 80mg  and rechecking LFTs in 6-8 weeks (dx- 272.4).  And rechecking cholesterol in 6 months.

## 2011-01-09 ENCOUNTER — Telehealth: Payer: Self-pay

## 2011-01-09 NOTE — Telephone Encounter (Signed)
Based on cholesterol level he needs better control and pravastatin can't get him the reduction that he needs.  He should switch to lipitor to get the appropriate reduction in risk.  And yes, he can take the meds together.  This is frequently done and we monitor the liver functions to make sure that he is not one of the rare people that has difficulty w/ this.

## 2011-01-09 NOTE — Telephone Encounter (Signed)
Pt questions if he is suppose to be taking the Lipitor along with the fenofibrate because he read that the 2 shouldn't be taken together. MD recommended that he switch from pravastatin to lipitor. Pt states that he doesn't really want to switch.

## 2011-01-09 NOTE — Telephone Encounter (Signed)
Pt aware and verbalized understanding.  

## 2011-01-15 ENCOUNTER — Other Ambulatory Visit: Payer: Self-pay | Admitting: Family Medicine

## 2011-01-22 ENCOUNTER — Other Ambulatory Visit: Payer: Self-pay | Admitting: Family Medicine

## 2011-02-08 ENCOUNTER — Telehealth: Payer: Self-pay

## 2011-02-08 MED ORDER — PRAVASTATIN SODIUM 80 MG PO TABS: 80.0000 mg | ORAL_TABLET | Freq: Every day | ORAL | Status: DC

## 2011-02-08 NOTE — Telephone Encounter (Signed)
Pt calls back and states that he does need refills on Pravastatin. [facility] [address]

## 2011-02-08 NOTE — Telephone Encounter (Signed)
Ok for him to resume Pravastatin.  No need for f/u labs.  Will need OV in 6 months to recheck cholesterol

## 2011-02-08 NOTE — Telephone Encounter (Signed)
Addended by: Beverely Low on: 02/08/2011 01:21 PM   Modules accepted: Orders

## 2011-02-08 NOTE — Telephone Encounter (Signed)
Left message on personally identified voicemail to notify pt

## 2011-02-08 NOTE — Telephone Encounter (Signed)
Done

## 2011-02-08 NOTE — Telephone Encounter (Signed)
Pt called office to advise adverse reaction to Lipitor, requested to change back to Pravastatin 80mg  per is now trying to lose weight. Has lost 10 pounds thus far. Wanted to know if he still needs to come in for labs on 03-09-11 to check Lipitor levels

## 2011-03-09 ENCOUNTER — Other Ambulatory Visit: Payer: BC Managed Care – PPO

## 2011-03-28 ENCOUNTER — Other Ambulatory Visit: Payer: Self-pay | Admitting: Family Medicine

## 2011-03-28 NOTE — Telephone Encounter (Signed)
rx sent to pharmacy by e-script  

## 2011-05-19 ENCOUNTER — Telehealth: Payer: Self-pay | Admitting: Family Medicine

## 2011-05-19 MED ORDER — FENOFIBRATE 160 MG PO TABS: 160.0000 mg | ORAL_TABLET | Freq: Every day | ORAL | Status: DC

## 2011-05-19 NOTE — Telephone Encounter (Signed)
Refill- fenofibrate 160mg  tablet. Take one tablet daily. Qty 30 last fill 12.27.12

## 2011-05-19 NOTE — Telephone Encounter (Signed)
rx sent to pharmacy by e-script  

## 2011-06-26 ENCOUNTER — Telehealth: Payer: Self-pay | Admitting: Family Medicine

## 2011-06-26 MED ORDER — HYDROCHLOROTHIAZIDE 25 MG PO TABS: 25.0000 mg | ORAL_TABLET | Freq: Every day | ORAL | Status: DC

## 2011-06-26 NOTE — Telephone Encounter (Signed)
Refill: Hydrochlorothiazide 25mg  tab. Take 1 tablet every morning. Qty 30. Last fill 1.30.13

## 2011-06-26 NOTE — Telephone Encounter (Signed)
rx sent to pharmacy by e-script #30 with 1 refill per pt noted to have upcoming apt on 08-14-11

## 2011-07-24 ENCOUNTER — Other Ambulatory Visit: Payer: Self-pay | Admitting: Family Medicine

## 2011-07-24 NOTE — Telephone Encounter (Signed)
Refill for  Amplodipine Besylate 5 MG Tab Qty 30  Take 1-tablet every day  Last filled 2.28.13

## 2011-07-25 MED ORDER — AMLODIPINE BESYLATE 5 MG PO TABS: 5.0000 mg | ORAL_TABLET | Freq: Every day | ORAL | Status: DC

## 2011-07-25 NOTE — Telephone Encounter (Signed)
rx sent to pharmacy by e-script  

## 2011-08-07 ENCOUNTER — Other Ambulatory Visit: Payer: Self-pay | Admitting: Family Medicine

## 2011-08-07 MED ORDER — PRAVASTATIN SODIUM 80 MG PO TABS: 80.0000 mg | ORAL_TABLET | Freq: Every day | ORAL | Status: DC

## 2011-08-07 NOTE — Telephone Encounter (Signed)
rx sent to pharmacy by e-script for #30

## 2011-08-07 NOTE — Telephone Encounter (Signed)
Refill Pravastatin Sodium 80MG  Tab Qty 30  Take 1-tablet daily Last filled 3.16.13  Last OV 9.6.12-CPE Patient has upcoming appt 4.22.13 for another CPE

## 2011-08-14 ENCOUNTER — Encounter: Payer: Self-pay | Admitting: Family Medicine

## 2011-08-14 ENCOUNTER — Ambulatory Visit (INDEPENDENT_AMBULATORY_CARE_PROVIDER_SITE_OTHER): Payer: BC Managed Care – PPO | Admitting: Family Medicine

## 2011-08-14 VITALS — BP 120/84 | HR 82 | Temp 98.5°F | Ht 69.25 in | Wt 246.7 lb

## 2011-08-14 DIAGNOSIS — E785 Hyperlipidemia, unspecified: Secondary | ICD-10-CM

## 2011-08-14 DIAGNOSIS — F17200 Nicotine dependence, unspecified, uncomplicated: Secondary | ICD-10-CM

## 2011-08-14 DIAGNOSIS — I10 Essential (primary) hypertension: Secondary | ICD-10-CM

## 2011-08-14 LAB — HEPATIC FUNCTION PANEL
ALT: 19 U/L (ref 0–53)
AST: 23 U/L (ref 0–37)
Albumin: 4.5 g/dL (ref 3.5–5.2)
Alkaline Phosphatase: 44 U/L (ref 39–117)
Total Protein: 7.5 g/dL (ref 6.0–8.3)

## 2011-08-14 LAB — BASIC METABOLIC PANEL
CO2: 24 mEq/L (ref 19–32)
Chloride: 109 mEq/L (ref 96–112)
Glucose, Bld: 112 mg/dL — ABNORMAL HIGH (ref 70–99)
Potassium: 4.1 mEq/L (ref 3.5–5.1)
Sodium: 142 mEq/L (ref 135–145)

## 2011-08-14 LAB — LIPID PANEL: VLDL: 21.4 mg/dL (ref 0.0–40.0)

## 2011-08-14 NOTE — Assessment & Plan Note (Signed)
Chronic problem.  Well controlled.  Asymptomatic.  No changes. 

## 2011-08-14 NOTE — Assessment & Plan Note (Signed)
Improving.  Pt down to 3 cigs/day using Ecig.  Applauded his efforts.  Will continue to follow.

## 2011-08-14 NOTE — Progress Notes (Signed)
  Subjective:    Patient ID: Thomas Proctor, male    DOB: 08/24/1960, 51 y.o.   MRN: [id number]  HPI HTN- chronic problem, on Norvasc and HCTZ.  BP well controlled today.  No CP, SOB, HAs, visual changes, edema.  Exercising 7 days/week- stationary bike, weights.  Hyperlipidemia- chronic problem.  Switched from Prava 80 to Lipitor 20mg  at last visit.  'i didn't like it so I switched right back'.  Working on [employer] and regular exercise.  No abd pain, N/V, myalgias.  Tobacco use- 3 weeks ago started using E cig.  Down to 3 cigarettes daily.  Has gained some weight but is doing well.   Review of Systems For ROS see HPI     Objective:   Physical Exam  Vitals reviewed. Constitutional: He is oriented to person, place, and time. He appears well-developed and well-nourished. No distress.  HENT:  Head: Normocephalic and atraumatic.  Eyes: Conjunctivae and EOM are normal. Pupils are equal, round, and reactive to light.  Neck: Normal range of motion. Neck supple. No thyromegaly present.  Cardiovascular: Normal rate, regular rhythm, normal heart sounds and intact distal pulses.   No murmur heard. Pulmonary/Chest: Effort normal and breath sounds normal. No respiratory distress.  Abdominal: Soft. Bowel sounds are normal. He exhibits no distension.  Musculoskeletal: He exhibits no edema.  Lymphadenopathy:    He has no cervical adenopathy.  Neurological: He is alert and oriented to person, place, and time. No cranial nerve deficit.  Skin: Skin is warm and dry.  Psychiatric: He has a normal mood and affect. His behavior is normal.          Assessment & Plan:

## 2011-08-14 NOTE — Patient Instructions (Signed)
Schedule your complete physical for late September- don't eat before this Keep up the good work!  You look great! I'm so proud of you for quitting smoking! You can totally do this! We'll notify you of your lab results!! Happy Spring!

## 2011-08-14 NOTE — Assessment & Plan Note (Signed)
Chronic problem.  Did not tolerate Lipitor.  On Zocor nightly.  Has completely changed diet.  Now getting regular exercise.  Check labs.  Adjust meds prn.

## 2011-08-16 ENCOUNTER — Encounter: Payer: Self-pay | Admitting: *Deleted

## 2011-08-16 ENCOUNTER — Encounter: Payer: Self-pay | Admitting: Family Medicine

## 2011-08-22 ENCOUNTER — Other Ambulatory Visit: Payer: Self-pay | Admitting: Family Medicine

## 2011-08-22 MED ORDER — HYDROCHLOROTHIAZIDE 25 MG PO TABS: 25.0000 mg | ORAL_TABLET | Freq: Every day | ORAL | Status: DC

## 2011-08-22 MED ORDER — AMLODIPINE BESYLATE 5 MG PO TABS: 5.0000 mg | ORAL_TABLET | Freq: Every day | ORAL | Status: DC

## 2011-08-22 NOTE — Telephone Encounter (Signed)
Rx sent 

## 2011-08-22 NOTE — Telephone Encounter (Signed)
Refill for  Amlodipine Besylate 5MG  Tab Qty 30 Take one tablet by mouth every day  Last filled 4.2.13  &  Hydrochlorothiazide 25MG  Tab Qty 30 Take one tablet by mouth daily Last filled 4.1.13  Last OV 4.22.13

## 2011-09-11 ENCOUNTER — Other Ambulatory Visit: Payer: Self-pay | Admitting: Family Medicine

## 2011-09-11 NOTE — Telephone Encounter (Signed)
Refills x 3 Last ov 4.22.13  1-Hydrochlorothiazise 25mg  tab Qty 30 Last fill 3.4.13 Take one tablet by mouth daily  2-amlodipine besylate 5mg  tab  Qty 30 last fill 2.28.13 Take one tablet every day   3-pravastatin sodium 80mg  tab Qty 30 Last fill 3.16.13 Take one tablet daily

## 2011-09-12 MED ORDER — PRAVASTATIN SODIUM 80 MG PO TABS: 80.0000 mg | ORAL_TABLET | Freq: Every day | ORAL | Status: DC

## 2011-09-12 MED ORDER — AMLODIPINE BESYLATE 5 MG PO TABS: 5.0000 mg | ORAL_TABLET | Freq: Every day | ORAL | Status: DC

## 2011-09-12 MED ORDER — HYDROCHLOROTHIAZIDE 25 MG PO TABS: 25.0000 mg | ORAL_TABLET | Freq: Every day | ORAL | Status: DC

## 2011-09-12 NOTE — Telephone Encounter (Signed)
rx sent to pharmacy by e-script  

## 2011-09-26 ENCOUNTER — Other Ambulatory Visit: Payer: Self-pay | Admitting: Family Medicine

## 2011-09-26 MED ORDER — FENOFIBRATE 160 MG PO TABS: 160.0000 mg | ORAL_TABLET | Freq: Every day | ORAL | Status: DC

## 2011-09-26 NOTE — Telephone Encounter (Signed)
Refill Fenofibrate 160MG  Tablet Qty 30 Take one tablet by mouth daily Last fill 4.30.13 Last ov 4.22.13

## 2011-09-26 NOTE — Telephone Encounter (Signed)
rx sent to pharmacy by e-script  

## 2011-10-05 ENCOUNTER — Telehealth: Payer: Self-pay | Admitting: *Deleted

## 2011-10-05 ENCOUNTER — Ambulatory Visit (INDEPENDENT_AMBULATORY_CARE_PROVIDER_SITE_OTHER): Payer: BC Managed Care – PPO | Admitting: Family Medicine

## 2011-10-05 ENCOUNTER — Encounter: Payer: Self-pay | Admitting: Family Medicine

## 2011-10-05 ENCOUNTER — Ambulatory Visit (HOSPITAL_BASED_OUTPATIENT_CLINIC_OR_DEPARTMENT_OTHER)
Admission: RE | Admit: 2011-10-05 | Discharge: 2011-10-05 | Disposition: A | Discharge status: Home / Self Care | Payer: BC Managed Care – PPO | Source: Ambulatory Visit | Attending: Family Medicine | Admitting: Family Medicine

## 2011-10-05 VITALS — BP 130/70 | HR 81 | Temp 98.5°F | Ht 69.5 in | Wt 253.0 lb

## 2011-10-05 DIAGNOSIS — R1032 Left lower quadrant pain: Secondary | ICD-10-CM

## 2011-10-05 DIAGNOSIS — K7689 Other specified diseases of liver: Secondary | ICD-10-CM | POA: Insufficient documentation

## 2011-10-05 DIAGNOSIS — I7090 Unspecified atherosclerosis: Secondary | ICD-10-CM | POA: Insufficient documentation

## 2011-10-05 DIAGNOSIS — Z9089 Acquired absence of other organs: Secondary | ICD-10-CM | POA: Insufficient documentation

## 2011-10-05 LAB — CBC WITH DIFFERENTIAL/PLATELET
Basophils Absolute: 0.1 10*3/uL (ref 0.0–0.1)
Eosinophils Absolute: 0.3 10*3/uL (ref 0.0–0.7)
Lymphocytes Relative: 32.2 % (ref 12.0–46.0)
MCHC: 33.8 g/dL (ref 30.0–36.0)
MCV: 91.7 fl (ref 78.0–100.0)
Monocytes Absolute: 0.9 10*3/uL (ref 0.1–1.0)
Neutrophils Relative %: 56.3 % (ref 43.0–77.0)
Platelets: 250 10*3/uL (ref 150.0–400.0)
WBC: 10.5 10*3/uL (ref 4.5–10.5)

## 2011-10-05 MED ORDER — METRONIDAZOLE 500 MG PO TABS: 500.0000 mg | ORAL_TABLET | Freq: Three times a day (TID) | ORAL | Status: AC

## 2011-10-05 MED ORDER — CIPROFLOXACIN HCL 500 MG PO TABS: 500.0000 mg | ORAL_TABLET | Freq: Two times a day (BID) | ORAL | Status: AC

## 2011-10-05 MED ORDER — HYDROCODONE-ACETAMINOPHEN 5-500 MG PO TABS: 1.0000 | ORAL_TABLET | Freq: Four times a day (QID) | ORAL | Status: AC | PRN

## 2011-10-05 MED ORDER — IOHEXOL 300 MG/ML  SOLN: 100.0000 mL | Freq: Once | INTRAMUSCULAR | Status: AC | PRN

## 2011-10-05 NOTE — Telephone Encounter (Signed)
Pt wife called to advise that pt is experiancing pain, MD Beverely Low advised to send pt Vicodin 5-500mg  #30 no refills one tablet Q6hrs Sent RX signed by MD Beverely Low to the pharmacy, pt wife noted at pharmacy and was advised that we will send the medication in asap, pt wife understood.

## 2011-10-05 NOTE — Telephone Encounter (Signed)
Spoke to pt to advise results/instructions. Pt understood for CT Scan and new RX were sent via escribe to [facility] per pt request, see results via lab entry 10-05-11

## 2011-10-05 NOTE — Assessment & Plan Note (Signed)
New.  Pt's sxs severe and sudden.  Differential includes- hernia, diverticulitis, obstruction.  Get CBC but up to 45% of diverticulitis pts have normal WBC.  Due to severity of pain will get CT scan to assess.  Reviewed supportive care and red flags that should prompt return.  Pt expressed understanding and is in agreement w/ plan.

## 2011-10-05 NOTE — Patient Instructions (Addendum)
We'll notify you of your lab and CT results and determine the next steps Call with any questions or concerns Hang in there!!!

## 2011-10-05 NOTE — Progress Notes (Signed)
  Subjective:    Patient ID: Thomas Proctor, male    DOB: Mar 09, 1961, 51 y.o.   MRN: [id number]  HPI Had URI x10-12 and 'i thought i coughed myself into a hernia'.  Started as a 'twinge at 8pm last night'.  Would wake himself at night due to pain from turning over.  No bulge.  Painful to walk when 'it gets bad'.  Pain was 'bad enough' at work this AM 'i got nauseous'.  No change in BM- no constipation, diarrhea.  Cough has improved.  No hx of hernia.  Pain was so severe that 'i thought i was going to call an ambulance'.   Review of Systems For ROS see HPI     Objective:   Physical Exam  Vitals reviewed. Constitutional: He appears well-developed and well-nourished. He appears distressed (obviously uncomfortable).  Neck: Neck supple.  Cardiovascular: Normal rate, regular rhythm, normal heart sounds and intact distal pulses.   No murmur heard. Pulmonary/Chest: Effort normal and breath sounds normal. No respiratory distress. He has no wheezes. He has no rales.  Abdominal: Soft. He exhibits no distension and no mass (no palpable hernia). There is tenderness (TTP over LLQ, pain out of proportion to exam). There is guarding (voluntary). There is no rebound.  Lymphadenopathy:    He has no cervical adenopathy.          Assessment & Plan:

## 2011-11-10 ENCOUNTER — Encounter: Payer: Self-pay | Admitting: Family Medicine

## 2011-11-10 ENCOUNTER — Ambulatory Visit (HOSPITAL_BASED_OUTPATIENT_CLINIC_OR_DEPARTMENT_OTHER)
Admission: RE | Admit: 2011-11-10 | Discharge: 2011-11-10 | Disposition: A | Discharge status: Home / Self Care | Payer: BC Managed Care – PPO | Source: Ambulatory Visit | Attending: Family Medicine | Admitting: Family Medicine

## 2011-11-10 ENCOUNTER — Ambulatory Visit (INDEPENDENT_AMBULATORY_CARE_PROVIDER_SITE_OTHER): Payer: BC Managed Care – PPO | Admitting: Family Medicine

## 2011-11-10 VITALS — BP 127/84 | HR 89 | Temp 97.9°F | Ht 69.0 in | Wt 247.4 lb

## 2011-11-10 DIAGNOSIS — M7989 Other specified soft tissue disorders: Secondary | ICD-10-CM

## 2011-11-10 DIAGNOSIS — M799 Soft tissue disorder, unspecified: Secondary | ICD-10-CM | POA: Insufficient documentation

## 2011-11-10 LAB — CBC WITH DIFFERENTIAL/PLATELET
Basophils Relative: 0.4 % (ref 0.0–3.0)
Eosinophils Absolute: 0.2 10*3/uL (ref 0.0–0.7)
Lymphs Abs: 3.3 10*3/uL (ref 0.7–4.0)
MCHC: 34.1 g/dL (ref 30.0–36.0)
MCV: 90.9 fl (ref 78.0–100.0)
Monocytes Absolute: 0.9 10*3/uL (ref 0.1–1.0)
Neutrophils Relative %: 60.9 % (ref 43.0–77.0)
Platelets: 262 10*3/uL (ref 150.0–400.0)

## 2011-11-10 NOTE — Progress Notes (Signed)
  Subjective:    Patient ID: Thomas Proctor, male    DOB: May 16, 1960, 51 y.o.   MRN: [id number]  HPI Face and neck swelling- woke up this AM w/ 'knot on jaw'.  R sided.  Uncomfortable.  No trouble breathing or swallowing.  Swelling is starting to improve.  No new foods or meds.  Denies tooth pain.  No fevers.  No known sick contacts.  Otherwise feeling well.   Review of Systems For ROS see HPI     Objective:   Physical Exam  Vitals reviewed. Constitutional: He appears well-developed and well-nourished. No distress.  HENT:  Head: Normocephalic and atraumatic.       R TM WNL No oral lesions, no tonsillar enlargement/erythema/exudate  Neck: Normal range of motion. Neck supple.  Lymphadenopathy:       Head (right side): Posterior auricular (large, TTP at junction of ear and angle of mandible) adenopathy present. No submental, no submandibular, no preauricular and no occipital adenopathy present.       Head (left side): No submental, no submandibular, no tonsillar, no preauricular, no posterior auricular and no occipital adenopathy present.          Assessment & Plan:

## 2011-11-10 NOTE — Assessment & Plan Note (Signed)
New.  Suspect LAD- cause unclear.  Due to pain and rapid enlargement will get CBC and [place] to assess.  Will determine need for abx based on lab and imaging results.  Pt expressed understanding and is in agreement w/ plan.

## 2011-11-10 NOTE — Patient Instructions (Addendum)
We'll notify you of your lab results and the [place] results Heat or ice- whichever feels better Call with any questions or concerns Hang in there!!!

## 2011-11-22 ENCOUNTER — Telehealth: Payer: Self-pay | Admitting: Family Medicine

## 2011-11-22 MED ORDER — FENOFIBRATE 160 MG PO TABS: 160.0000 mg | ORAL_TABLET | Freq: Every day | ORAL | Status: DC

## 2011-11-22 NOTE — Telephone Encounter (Signed)
rx sent to pharmacy by e-script  

## 2011-11-22 NOTE — Telephone Encounter (Signed)
Refill: Fenofibrate 160mg  tablet. Take 1 tablet by mouth daily. Qty 30. Last fill 08-22-11

## 2011-12-12 ENCOUNTER — Other Ambulatory Visit: Payer: Self-pay | Admitting: Family Medicine

## 2011-12-13 NOTE — Telephone Encounter (Signed)
Refill done.  

## 2011-12-20 ENCOUNTER — Other Ambulatory Visit: Payer: Self-pay | Admitting: Family Medicine

## 2011-12-20 NOTE — Telephone Encounter (Signed)
rx sent to pharmacy by e-script  

## 2011-12-23 ENCOUNTER — Other Ambulatory Visit: Payer: Self-pay | Admitting: Family Medicine

## 2011-12-26 NOTE — Telephone Encounter (Signed)
rx sent to pharmacy by e-script  

## 2012-01-18 ENCOUNTER — Telehealth: Payer: Self-pay | Admitting: Family Medicine

## 2012-01-18 MED ORDER — FENOFIBRATE 160 MG PO TABS: 160.0000 mg | ORAL_TABLET | Freq: Every day | ORAL | Status: DC

## 2012-01-18 NOTE — Telephone Encounter (Signed)
rx sent to pharmacy by e-script  

## 2012-01-18 NOTE — Telephone Encounter (Signed)
Refill: Fenofibrate 160 mg tablet. Take 1 tablet by mouth every day. Qty 30. Last fill 8.28.13

## 2012-01-22 ENCOUNTER — Encounter: Payer: BC Managed Care – PPO | Admitting: Family Medicine

## 2012-02-26 ENCOUNTER — Other Ambulatory Visit: Payer: Self-pay

## 2012-02-26 MED ORDER — PRAVASTATIN SODIUM 80 MG PO TABS: 80.0000 mg | ORAL_TABLET | Freq: Every day | ORAL | Status: DC

## 2012-02-26 NOTE — Telephone Encounter (Signed)
Pt wife states moved from [place] to [facility] asking for 10 day Rx of Pravastatin because pt can't see new doctor for awhile and pt will be out of med. If so PLz print Rx and I will fax it to new pharmacy. Plz advise   MW

## 2012-03-18 ENCOUNTER — Telehealth: Payer: Self-pay | Admitting: Family Medicine

## 2012-03-18 NOTE — Telephone Encounter (Signed)
Refill: hctz 25 mg tab. Take 1 tablet by mouth daily. Qty 30. Last fill  02-13-12

## 2012-03-19 MED ORDER — HYDROCHLOROTHIAZIDE 25 MG PO TABS: 25.0000 mg | ORAL_TABLET | Freq: Every day | ORAL | Status: DC

## 2012-03-19 NOTE — Telephone Encounter (Signed)
Rx sent.    MW 

## 2012-06-08 ENCOUNTER — Other Ambulatory Visit: Payer: Self-pay

## 2012-10-23 ENCOUNTER — Encounter: Payer: Self-pay | Admitting: Internal Medicine

## 2012-10-23 DIAGNOSIS — Z860100 Personal history of colon polyps, unspecified: Secondary | ICD-10-CM

## 2012-10-23 HISTORY — DX: Personal history of colon polyps, unspecified: Z86.0100

## 2012-10-23 HISTORY — DX: Personal history of colonic polyps: Z86.010

## 2012-10-24 ENCOUNTER — Encounter: Payer: Self-pay | Admitting: Internal Medicine

## 2013-02-27 ENCOUNTER — Other Ambulatory Visit: Payer: Self-pay

## 2013-07-14 ENCOUNTER — Encounter: Payer: Self-pay | Admitting: Internal Medicine

## 2013-11-27 IMAGING — CT CT ABD-PELV W/ CM
2 of 5 series · 17 of 46 positions shown, 19 images · IV contrast (APPLIED)
Comparison: No similar prior study is available for comparison.

CLINICAL DATA: Left lower quadrant abdominal pain

CT ABDOMEN AND PELVIS WITH CONTRAST
TECHNIQUE: Multidetector CT imaging of the abdomen and pelvis was
performed following the standard protocol during bolus
administration of intravenous contrast.
Contrast:  100 ml Omnipaque 300 IV contrast

[Series 2: abd/pelvis 5.0 b31f · axial · 0.93mm/px · z∈[-505,-55]mm · 14 of 102 slices shown, 16 images]
[im 6/102  soft-tissue]
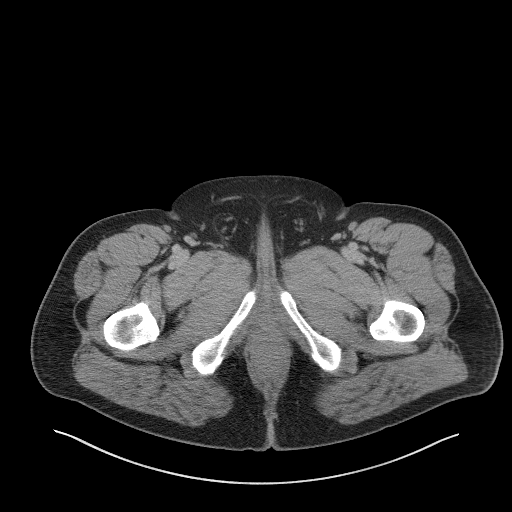
[im 6/102  bone]
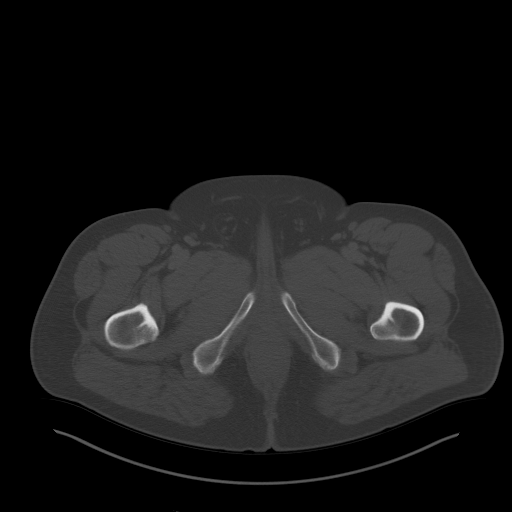
[im 11/102  soft-tissue]
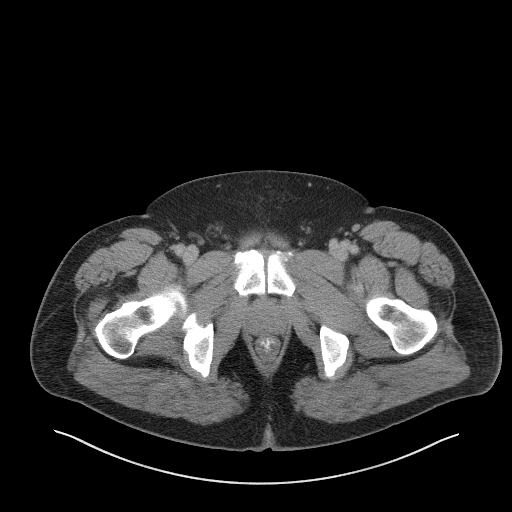
[im 22/102  soft-tissue]
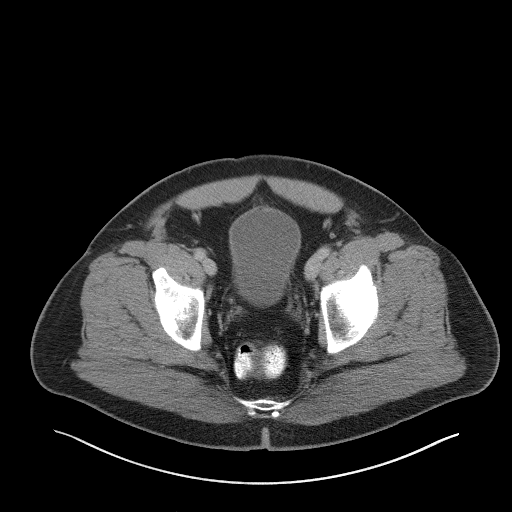
[im 27/102  soft-tissue]
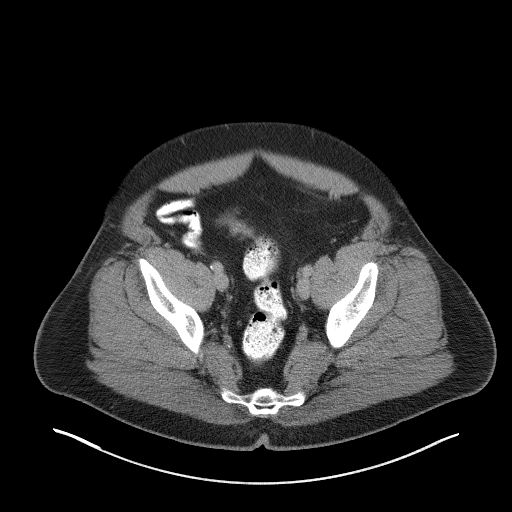
[im 32/102  soft-tissue]
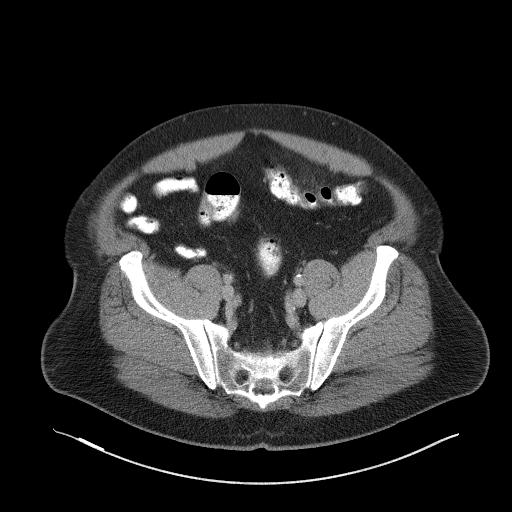
[im 43/102  soft-tissue]
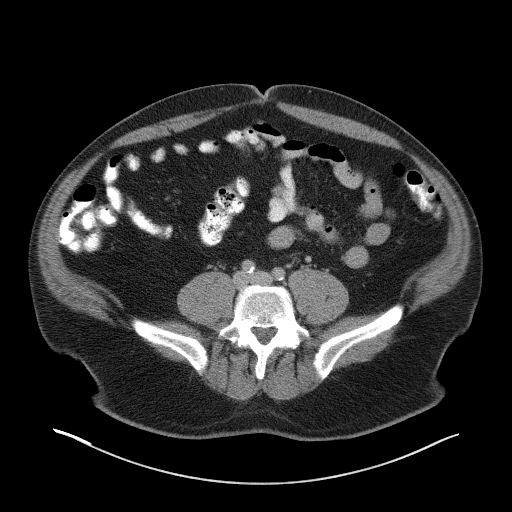
[im 48/102  soft-tissue]
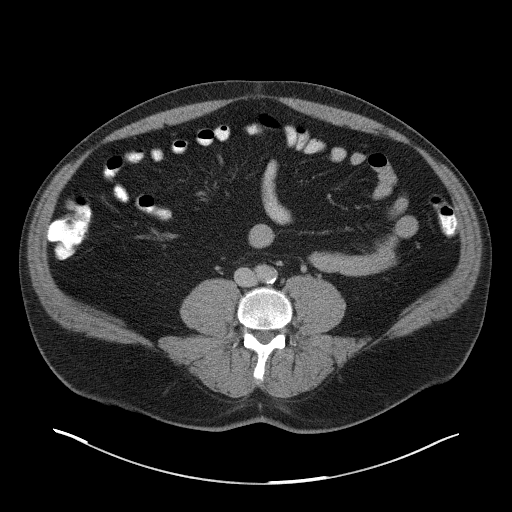
[im 54/102  soft-tissue]
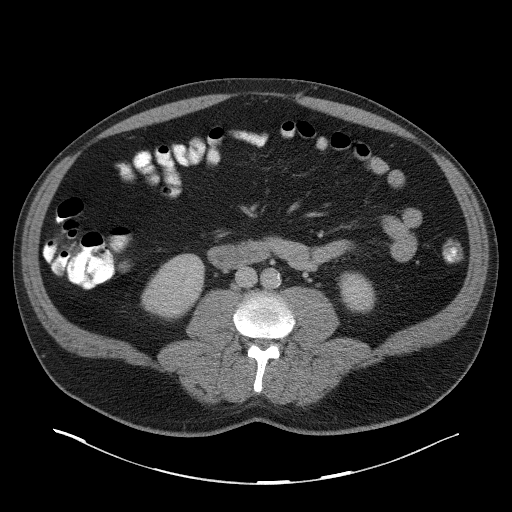
[im 59/102  soft-tissue]
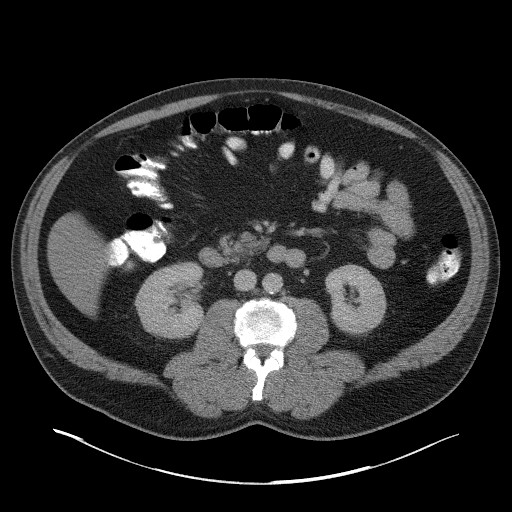
[im 59/102  bone]
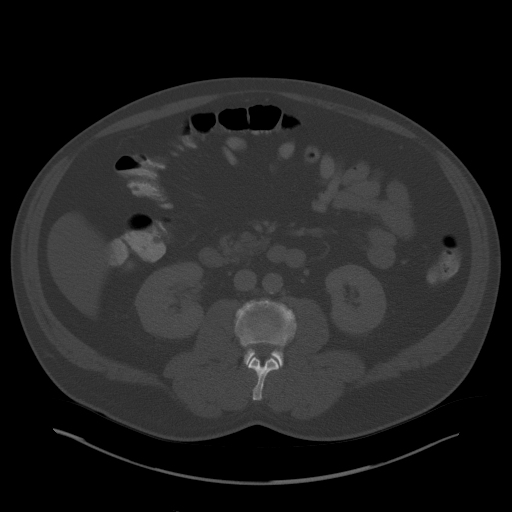
[im 70/102  soft-tissue]
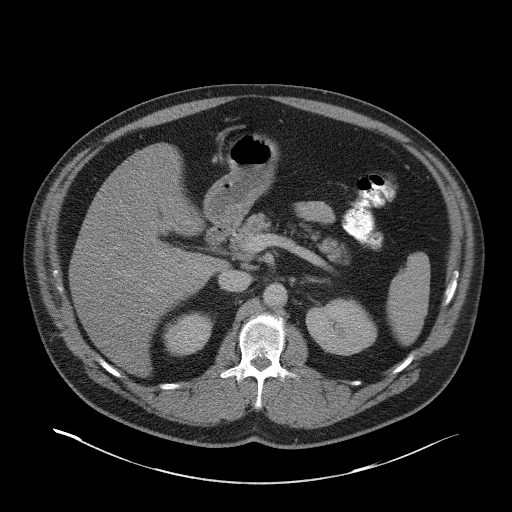
[im 75/102  soft-tissue]
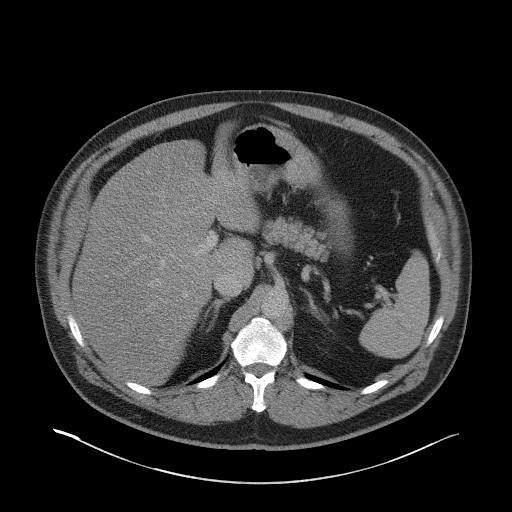
[im 80/102  soft-tissue]
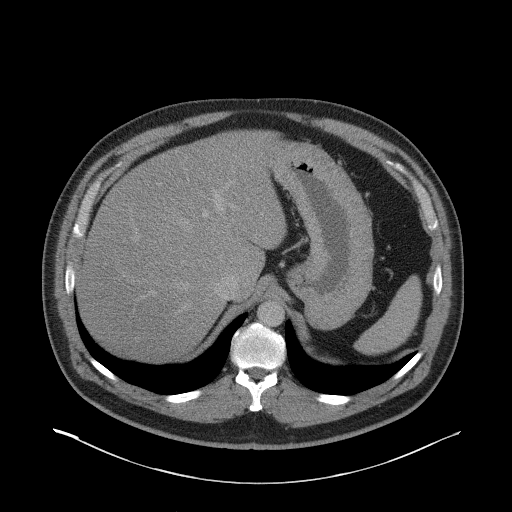
[im 91/102  soft-tissue]
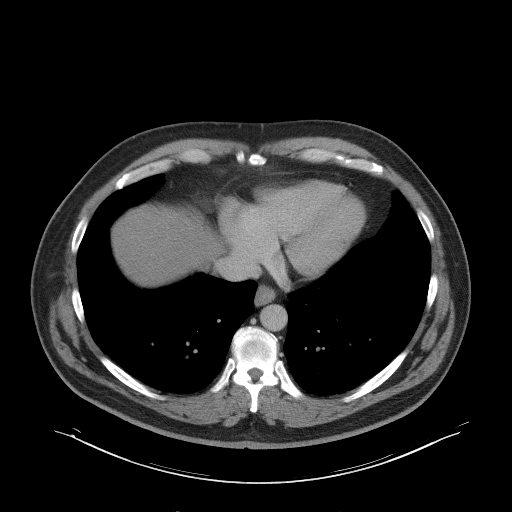
[im 96/102  soft-tissue]
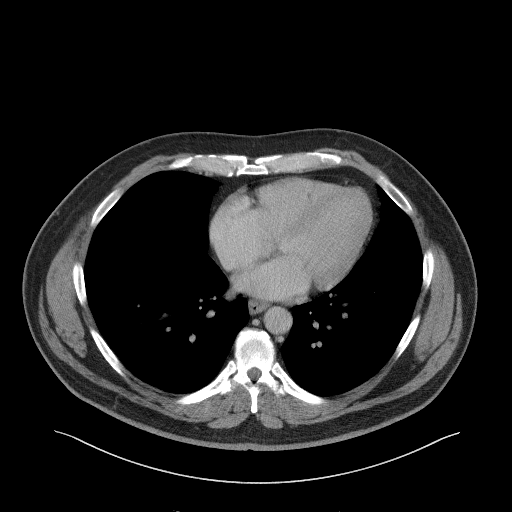

[Series 5: abd/pelvis 3.0 coronal · coronal · 1.13mm/px · 3 of 115 slices shown]
[im 39/115  soft-tissue]
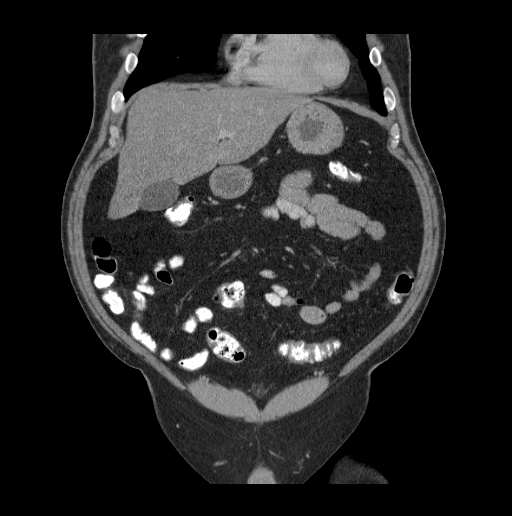
[im 51/115  soft-tissue]
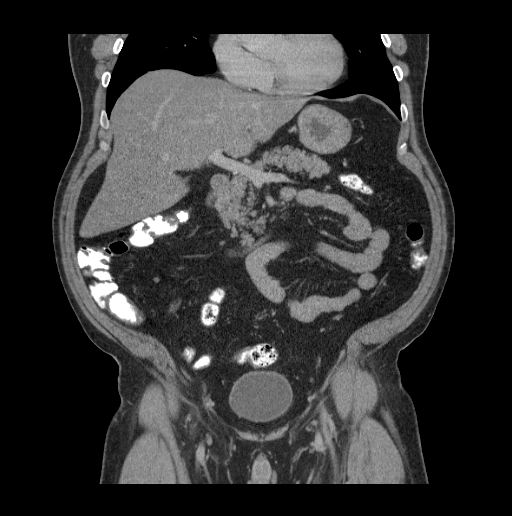
[im 64/115  soft-tissue]
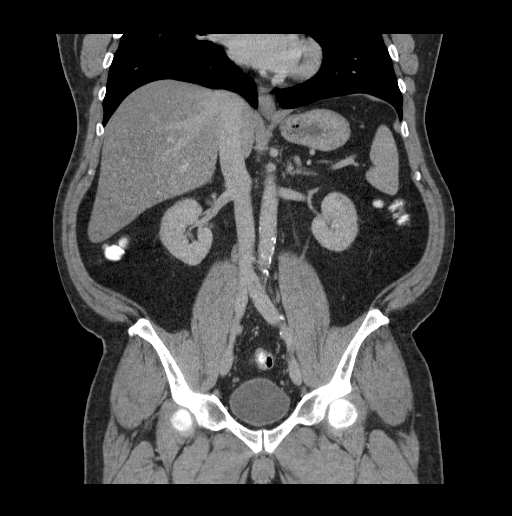

[17 of 46 positions shown; findings below may reference images not displayed]

FINDINGS: Lung bases are clear.  Hepatic steatosis noted.
Gallbladder, adrenal glands, kidneys, spleen, and pancreas are
normal.  No focal hepatic abnormality.  No free air or fluid.

There is minimal proximal sigmoid colonic wall thickening and
adjacent stranding.  No pelvic free fluid.  No surrounding rim
enhancing fluid collection to suggest abscess.  Bladder is partly
collapsed.  Small bowel unremarkable.  Appendix surgically absent.
Mild atherosclerotic aortic calcification without aneurysm.

No acute osseous finding.
IMPRESSION: Minimal proximal sigmoid colonic wall thickening and adjacent
stranding, compatible with early diverticulitis.  Colonoscopy is
recommended after complete recovery, if not already performed as
part of the patient's general medical care.

## 2014-01-02 IMAGING — US US SOFT TISSUE HEAD/NECK
1 series · 6 of 6 positions shown · non-contrast
Comparison: None.

CLINICAL DATA: Right sided mandibular and postauricular soft tissue
mass.

ULTRASOUND OF HEAD/NECK SOFT TISSUES
TECHNIQUE: Ultrasound examination of the head and neck soft
tissues was performed in the area of clinical concern.

[Series 1: us soft tissue head/neck · 0.04mm/px · 6 of 6 slices shown]
[im 1/6]
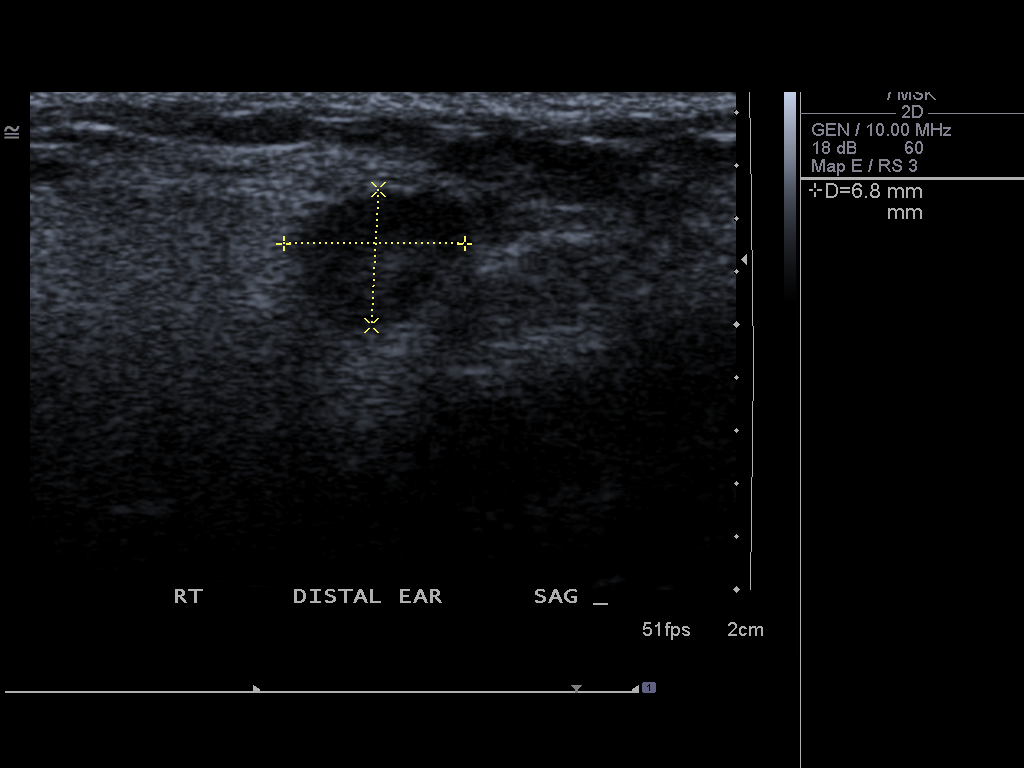
[im 2/6]
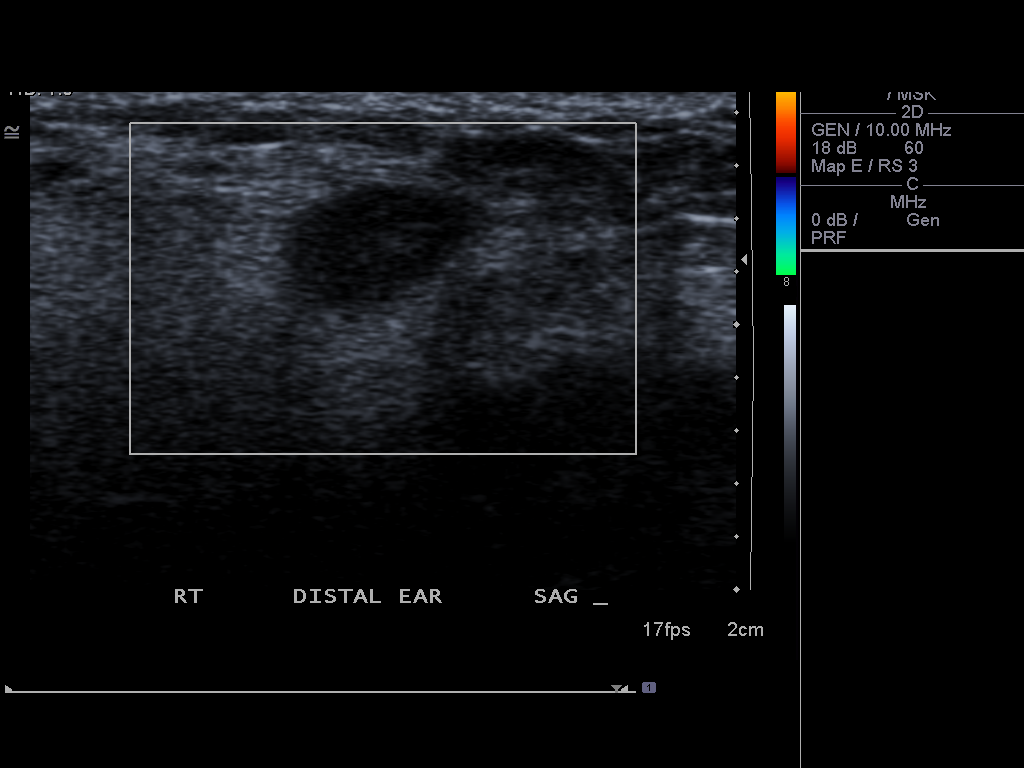
[im 3/6]
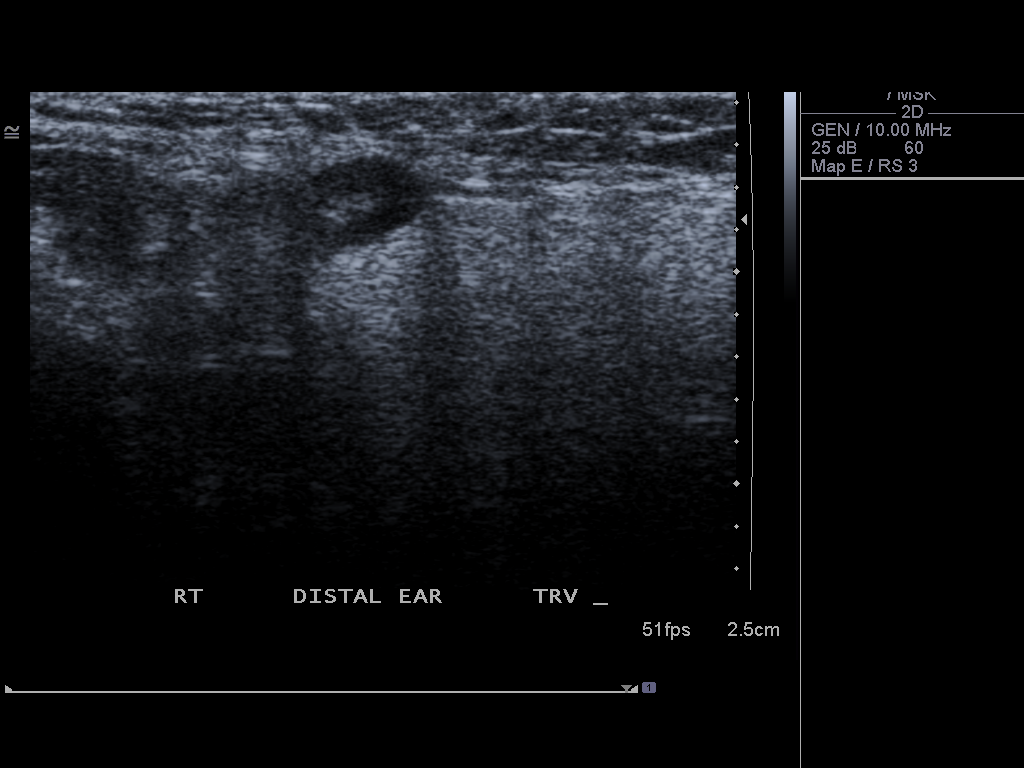
[im 4/6]
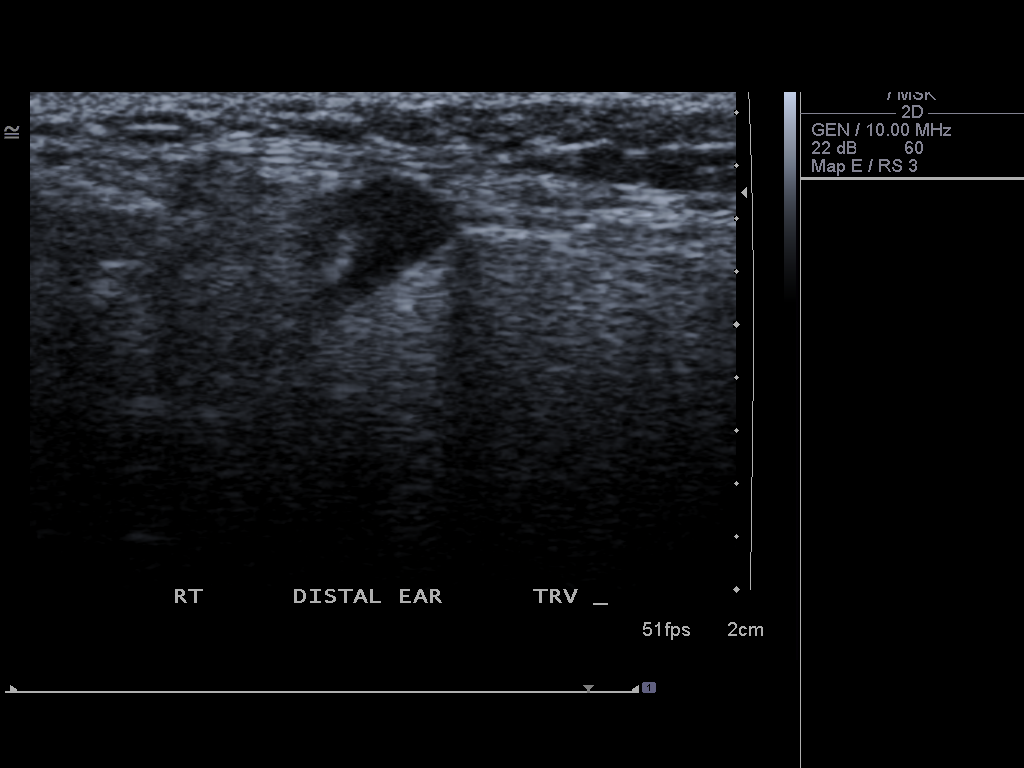
[im 5/6]
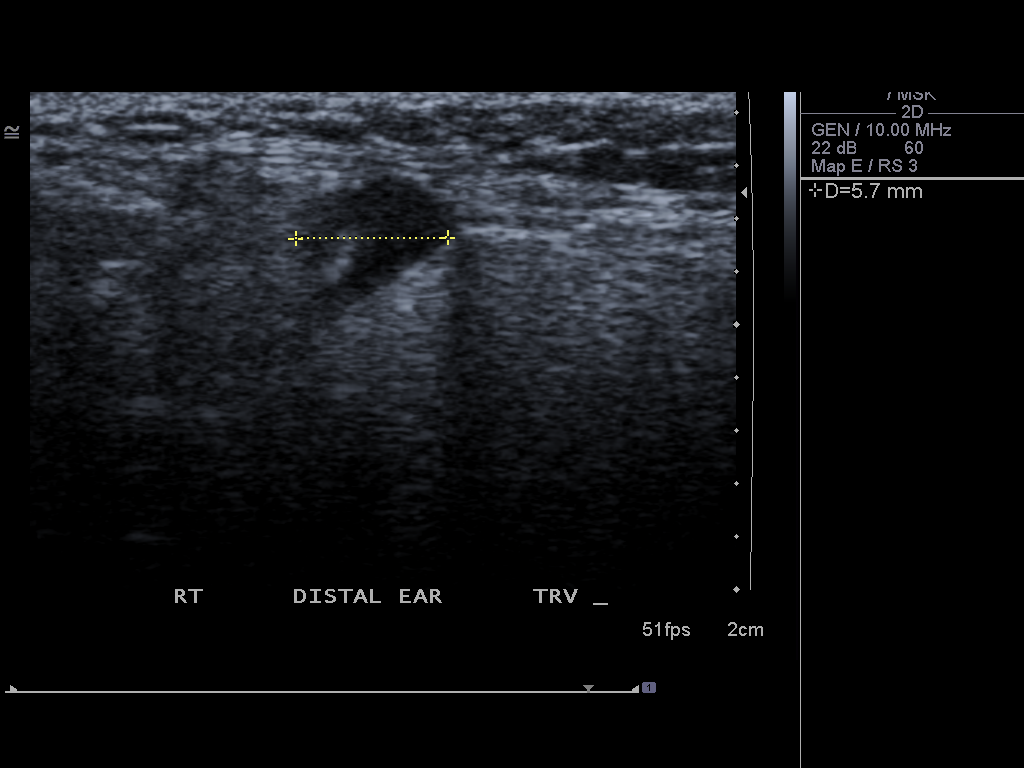
[im 6/6]
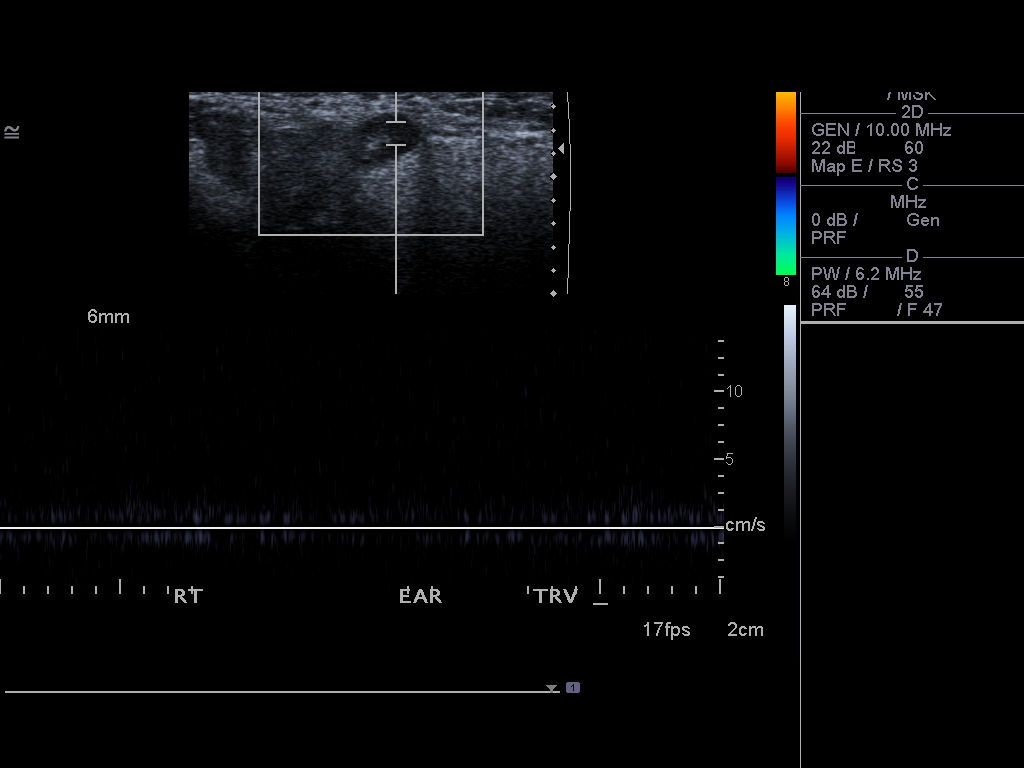

[6 of 6 positions shown; findings below may reference images not displayed]

FINDINGS: There is a palpable and visible 0.7 x 0.5 x 0.6 cm lymph
node with a fatty hilum at the area of concern.  This is felt to
represent a benign reactive lymph node.  No visible abscess.
IMPRESSION: Reactive lymph node in the area of concern.

## 2014-11-10 ENCOUNTER — Encounter: Payer: Self-pay | Admitting: Internal Medicine

## 2022-02-27 ENCOUNTER — Telehealth: Payer: Self-pay | Admitting: Family Medicine

## 2022-02-27 NOTE — Telephone Encounter (Signed)
This patient called and stated that he has moved back to the [place] area and used to be a patient of Dr Virgil Benedict but he's sure it's been about ten years ago, he still wanted to see if she would be willing to accept him back. I let him know that I would send a message and we would call him back

## 2022-03-01 NOTE — Telephone Encounter (Signed)
Called patient and scheduled for 08/23/22 at 10:00am with assistance from [place] [facility]

## 2022-03-01 NOTE — Telephone Encounter (Signed)
Ok to re-establish 

## 2022-03-01 NOTE — Telephone Encounter (Signed)
Patient called to see if there was a determination on whether he can return as a patient

## 2022-03-02 NOTE — Telephone Encounter (Signed)
Mailed out NP paperwork.

## 2022-03-28 ENCOUNTER — Other Ambulatory Visit: Payer: Self-pay

## 2022-08-15 ENCOUNTER — Ambulatory Visit: Payer: Self-pay

## 2022-08-23 ENCOUNTER — Ambulatory Visit: Payer: Managed Care, Other (non HMO) | Admitting: Family Medicine

## 2022-08-24 ENCOUNTER — Other Ambulatory Visit: Payer: Self-pay | Admitting: Family Medicine

## 2022-08-24 NOTE — Telephone Encounter (Signed)
Patient has requested fill of this medication has an appt with you on 09/08/2022   Please advise

## 2022-08-24 NOTE — Telephone Encounter (Signed)
Caller name: Aric Jost  On DPR?: Yes  Call back number: [phone] (mobile)  Provider they see: No primary care provider on file.  Reason for call: Pt has appt 09/08/2022 and with be without his BP med- telmisartan (MICARDIS) 40 MG tablet 4 days prior ----advise

## 2022-08-25 MED ORDER — TELMISARTAN 40 MG PO TABS: 40.0000 mg | ORAL_TABLET | Freq: Every day | ORAL | 1 refills | Status: DC

## 2022-08-25 NOTE — Telephone Encounter (Signed)
Called LM telling him this was sent

## 2022-09-07 DIAGNOSIS — M7502 Adhesive capsulitis of left shoulder: Secondary | ICD-10-CM | POA: Insufficient documentation

## 2022-09-08 ENCOUNTER — Encounter: Payer: Self-pay | Admitting: Family Medicine

## 2022-09-08 ENCOUNTER — Ambulatory Visit (INDEPENDENT_AMBULATORY_CARE_PROVIDER_SITE_OTHER): Payer: PRIVATE HEALTH INSURANCE | Admitting: Family Medicine

## 2022-09-08 VITALS — BP 136/78 | HR 96 | Temp 97.8°F | Resp 18 | Ht 69.0 in | Wt 257.0 lb

## 2022-09-08 DIAGNOSIS — I10 Essential (primary) hypertension: Secondary | ICD-10-CM | POA: Diagnosis not present

## 2022-09-08 DIAGNOSIS — E785 Hyperlipidemia, unspecified: Secondary | ICD-10-CM

## 2022-09-08 LAB — BASIC METABOLIC PANEL
BUN: 18 mg/dL (ref 6–23)
CO2: 28 mEq/L (ref 19–32)
Calcium: 9.8 mg/dL (ref 8.4–10.5)
Chloride: 104 mEq/L (ref 96–112)
Creatinine, Ser: 0.82 mg/dL (ref 0.40–1.50)
GFR: 93.5 mL/min (ref >60.00)
Glucose, Bld: 119 mg/dL — ABNORMAL HIGH (ref 70–99)
Potassium: 4.4 mEq/L (ref 3.5–5.1)
Sodium: 139 mEq/L (ref 135–145)

## 2022-09-08 LAB — LIPID PANEL
Cholesterol: 148 mg/dL (ref 0–200)
HDL: 36.9 mg/dL — ABNORMAL LOW (ref >39.00)
LDL Cholesterol: 75 mg/dL (ref 0–99)
NonHDL: 110.79
Total CHOL/HDL Ratio: 4
Triglycerides: 179 mg/dL — ABNORMAL HIGH (ref 0.0–149.0)
VLDL: 35.8 mg/dL (ref 0.0–40.0)

## 2022-09-08 LAB — HEPATIC FUNCTION PANEL
ALT: 15 U/L (ref 0–53)
AST: 16 U/L (ref 0–37)
Albumin: 4.6 g/dL (ref 3.5–5.2)
Alkaline Phosphatase: 53 U/L (ref 39–117)
Bilirubin, Direct: 0.2 mg/dL (ref 0.0–0.3)
Total Bilirubin: 0.9 mg/dL (ref 0.2–1.2)
Total Protein: 7.6 g/dL (ref 6.0–8.3)

## 2022-09-08 LAB — HEMOGLOBIN A1C: Hgb A1c MFr Bld: 5.4 % (ref 4.6–6.5)

## 2022-09-08 LAB — TSH: TSH: 2.92 u[IU]/mL (ref 0.35–5.50)

## 2022-09-08 LAB — CBC WITH DIFFERENTIAL/PLATELET
Basophils Absolute: 0.1 10*3/uL (ref 0.0–0.1)
Basophils Relative: 0.7 % (ref 0.0–3.0)
Eosinophils Absolute: 0.2 10*3/uL (ref 0.0–0.7)
Eosinophils Relative: 2.1 % (ref 0.0–5.0)
HCT: 46.5 % (ref 39.0–52.0)
Hemoglobin: 16.1 g/dL (ref 13.0–17.0)
Lymphocytes Relative: 36 % (ref 12.0–46.0)
Lymphs Abs: 2.9 10*3/uL (ref 0.7–4.0)
MCHC: 34.6 g/dL (ref 30.0–36.0)
MCV: 88.4 fl (ref 78.0–100.0)
Monocytes Absolute: 0.9 10*3/uL (ref 0.1–1.0)
Monocytes Relative: 11.4 % (ref 3.0–12.0)
Neutro Abs: 4.1 10*3/uL (ref 1.4–7.7)
Neutrophils Relative %: 49.8 % (ref 43.0–77.0)
Platelets: 262 10*3/uL (ref 150.0–400.0)
RBC: 5.26 Mil/uL (ref 4.22–5.81)
RDW: 13.8 % (ref 11.5–15.5)
WBC: 8.2 10*3/uL (ref 4.0–10.5)

## 2022-09-08 NOTE — Progress Notes (Signed)
   Subjective:    Patient ID: Thomas Proctor, male    DOB: 1961/04/10, 62 y.o.   MRN: [id number]  HPI HTN- chronic problem, on Amlodipine 10mg  daily and Telmisartan 40mg  daily w/ adequate control.  Pt reports feeling 'great'.  No CP, SOB, HA's, visual changes, edema.  Hyperlipidemia- chronic problem, on Lipitor 20mg  daily.  No abd pain, N/V.  Obesity- pt's BMI is 37.95.  pt is walking 3x/week and does resistance training.  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     Review of Systems For ROS see HPI     Objective:   Physical Exam Vitals reviewed.  Constitutional:      General: He is not in acute distress.    Appearance: Normal appearance. He is well-developed. He is obese. He is not ill-appearing.  HENT:     Head: Normocephalic and atraumatic.  Eyes:     Extraocular Movements: Extraocular movements intact.     Conjunctiva/sclera: Conjunctivae normal.     Pupils: Pupils are equal, round, and reactive to light.  Neck:     Thyroid: No thyromegaly.  Cardiovascular:     Rate and Rhythm: Normal rate and regular rhythm.     Pulses: Normal pulses.     Heart sounds: Normal heart sounds. No murmur heard. Pulmonary:     Effort: Pulmonary effort is normal. No respiratory distress.     Breath sounds: Normal breath sounds.  Abdominal:     General: Bowel sounds are normal. There is no distension.     Palpations: Abdomen is soft.  Musculoskeletal:     Cervical back: Normal range of motion and neck supple.     Right lower leg: No edema.     Left lower leg: No edema.  Lymphadenopathy:     Cervical: No cervical adenopathy.  Skin:    General: Skin is warm and dry.  Neurological:     General: No focal deficit present.     Mental Status: He is alert and oriented to person, place, and time.     Cranial Nerves: No cranial nerve deficit.  Psychiatric:        Mood and Affect: Mood normal.        Behavior: Behavior normal.           Assessment & Plan:

## 2022-09-08 NOTE — Assessment & Plan Note (Signed)
Chronic problem.  Tolerating Lipitor w/o difficulty.  Check labs.  Adjust meds prn  

## 2022-09-08 NOTE — Patient Instructions (Signed)
Schedule your complete physical in 6 months We'll notify you of your lab results and make any changes if needed Keep up the good work on healthy diet and regular exercise- you're doing great! Call with any questions or concerns Stay Safe!  Stay Healthy! Welcome Back!!!

## 2022-09-08 NOTE — Assessment & Plan Note (Signed)
Chronic problem.  Adequate control on Amlodipine 10mg  and Telmisartan 40mg  daily.  Currently asymptomatic.  Check labs due to ARB [place] but no anticipated med changes.  Will follow

## 2022-09-08 NOTE — Assessment & Plan Note (Signed)
Pt's BMI is 37.95 which places him in the morbidly obese range when coupled w/ HTN and hyperlipidemia.  He is exercising regularly and I applauded his efforts.  Will continue to follow.

## 2022-09-11 ENCOUNTER — Telehealth: Payer: Self-pay

## 2022-09-11 ENCOUNTER — Other Ambulatory Visit: Payer: Self-pay

## 2022-09-11 MED ORDER — AMLODIPINE BESYLATE 10 MG PO TABS: 10.0000 mg | ORAL_TABLET | Freq: Every day | ORAL | 6 refills | Status: DC

## 2022-09-11 MED ORDER — ATORVASTATIN CALCIUM 20 MG PO TABS: 20.0000 mg | ORAL_TABLET | Freq: Every day | ORAL | 6 refills | Status: DC

## 2022-09-11 MED ORDER — TELMISARTAN 40 MG PO TABS: 40.0000 mg | ORAL_TABLET | Freq: Every day | ORAL | 1 refills | Status: DC

## 2022-09-11 NOTE — Telephone Encounter (Signed)
-----   Message from Katherine E Tabori, MD sent at 09/11/2022  7:44 AM EDT ----- Labs look great!  No changes at this time 

## 2022-09-11 NOTE — Telephone Encounter (Signed)
Pt aware of lab results 

## 2022-12-25 ENCOUNTER — Other Ambulatory Visit: Payer: Self-pay | Admitting: Family Medicine

## 2023-02-19 ENCOUNTER — Other Ambulatory Visit: Payer: Self-pay | Admitting: Family Medicine

## 2023-03-27 ENCOUNTER — Encounter: Payer: Managed Care, Other (non HMO) | Admitting: Family Medicine

## 2023-03-29 ENCOUNTER — Other Ambulatory Visit: Payer: Self-pay | Admitting: Family Medicine

## 2023-03-29 MED ORDER — TELMISARTAN 40 MG PO TABS: 40.0000 mg | ORAL_TABLET | Freq: Every day | ORAL | 0 refills | Status: DC

## 2023-04-03 ENCOUNTER — Encounter: Payer: Self-pay | Admitting: Family Medicine

## 2023-04-03 MED ORDER — ATORVASTATIN CALCIUM 20 MG PO TABS: 20.0000 mg | ORAL_TABLET | Freq: Every day | ORAL | 6 refills | Status: DC

## 2023-04-03 MED ORDER — AMLODIPINE BESYLATE 10 MG PO TABS: 10.0000 mg | ORAL_TABLET | Freq: Every day | ORAL | 6 refills | Status: DC

## 2023-04-03 NOTE — Addendum Note (Signed)
Addended by: Sheliah Hatch on: 04/03/2023 12:24 PM   Modules accepted: Orders

## 2023-05-03 ENCOUNTER — Encounter: Payer: Self-pay | Admitting: Family Medicine

## 2023-05-03 ENCOUNTER — Ambulatory Visit (INDEPENDENT_AMBULATORY_CARE_PROVIDER_SITE_OTHER): Payer: PRIVATE HEALTH INSURANCE | Admitting: Family Medicine

## 2023-05-03 VITALS — BP 112/62 | HR 83 | Temp 99.2°F | Ht 69.0 in | Wt 250.0 lb

## 2023-05-03 DIAGNOSIS — Z6836 Body mass index (BMI) 36.0-36.9, adult: Secondary | ICD-10-CM | POA: Diagnosis not present

## 2023-05-03 DIAGNOSIS — Z125 Encounter for screening for malignant neoplasm of prostate: Secondary | ICD-10-CM

## 2023-05-03 DIAGNOSIS — Z0000 Encounter for general adult medical examination without abnormal findings: Secondary | ICD-10-CM

## 2023-05-03 DIAGNOSIS — Z720 Tobacco use: Secondary | ICD-10-CM

## 2023-05-03 LAB — BASIC METABOLIC PANEL
BUN: 14 mg/dL (ref 6–23)
CO2: 28 meq/L (ref 19–32)
Calcium: 9.8 mg/dL (ref 8.4–10.5)
Chloride: 103 meq/L (ref 96–112)
Creatinine, Ser: 0.79 mg/dL (ref 0.40–1.50)
GFR: 94.12 mL/min (ref >60.00)
Glucose, Bld: 122 mg/dL — ABNORMAL HIGH (ref 70–99)
Potassium: 4.7 meq/L (ref 3.5–5.1)
Sodium: 140 meq/L (ref 135–145)

## 2023-05-03 LAB — CBC WITH DIFFERENTIAL/PLATELET
Basophils Absolute: 0 10*3/uL (ref 0.0–0.1)
Basophils Relative: 0.6 % (ref 0.0–3.0)
Eosinophils Absolute: 0.1 10*3/uL (ref 0.0–0.7)
Eosinophils Relative: 1 % (ref 0.0–5.0)
HCT: 46.2 % (ref 39.0–52.0)
Hemoglobin: 15.8 g/dL (ref 13.0–17.0)
Lymphocytes Relative: 27.8 % (ref 12.0–46.0)
Lymphs Abs: 2.2 10*3/uL (ref 0.7–4.0)
MCHC: 34.1 g/dL (ref 30.0–36.0)
MCV: 90.4 fL (ref 78.0–100.0)
Monocytes Absolute: 0.7 10*3/uL (ref 0.1–1.0)
Monocytes Relative: 8.9 % (ref 3.0–12.0)
Neutro Abs: 4.9 10*3/uL (ref 1.4–7.7)
Neutrophils Relative %: 61.7 % (ref 43.0–77.0)
Platelets: 258 10*3/uL (ref 150.0–400.0)
RBC: 5.11 Mil/uL (ref 4.22–5.81)
RDW: 13.2 % (ref 11.5–15.5)
WBC: 7.9 10*3/uL (ref 4.0–10.5)

## 2023-05-03 LAB — PSA: PSA: 1.06 ng/mL (ref 0.10–4.00)

## 2023-05-03 LAB — LIPID PANEL
Cholesterol: 159 mg/dL (ref 0–200)
HDL: 33.4 mg/dL — ABNORMAL LOW (ref >39.00)
LDL Cholesterol: 92 mg/dL (ref 0–99)
NonHDL: 125.82
Total CHOL/HDL Ratio: 5
Triglycerides: 168 mg/dL — ABNORMAL HIGH (ref 0.0–149.0)
VLDL: 33.6 mg/dL (ref 0.0–40.0)

## 2023-05-03 LAB — TSH: TSH: 2.27 u[IU]/mL (ref 0.35–5.50)

## 2023-05-03 LAB — HEPATIC FUNCTION PANEL
ALT: 10 U/L (ref 0–53)
AST: 13 U/L (ref 0–37)
Albumin: 4.8 g/dL (ref 3.5–5.2)
Alkaline Phosphatase: 61 U/L (ref 39–117)
Bilirubin, Direct: 0.2 mg/dL (ref 0.0–0.3)
Total Bilirubin: 1 mg/dL (ref 0.2–1.2)
Total Protein: 7.4 g/dL (ref 6.0–8.3)

## 2023-05-03 MED ORDER — LINACLOTIDE 145 MCG PO CAPS: 145.0000 ug | ORAL_CAPSULE | Freq: Every day | ORAL | 3 refills | Status: DC

## 2023-05-03 NOTE — Patient Instructions (Signed)
 Follow up in 6 months to recheck blood pressure and cholesterol We'll notify you of your lab results and make any changes if needed Continue to work on healthy diet and regular exercise- you're doing great! START the Linzess  to help w/ constipation Call with any questions or concerns Stay Safe!  Stay Healthy! Happy New Year!!!

## 2023-05-03 NOTE — Assessment & Plan Note (Signed)
 Improving!  Pt has lost 7 lbs since last visit and more overall as he reports his weight increased before he started losing.  Applauded his efforts.  Check labs to risk stratify.  Will follow.

## 2023-05-03 NOTE — Progress Notes (Signed)
   Subjective:    Patient ID: Thomas Proctor, male    DOB: 01/09/1961, 63 y.o.   MRN: [id number]  HPI CPE- due for Tdap, flu, shingles, HIV/Hep C screening.  Pt declines all vaccines and HIV/Hep C screening.  Pt reports feeling 'fantastic'  Patient Care Team    Relationship Specialty Notifications Start End  Mahlon Comer BRAVO, MD PCP - General Family Medicine  09/08/22     Health Maintenance  Topic Date Due   HIV Screening  Never done   Hepatitis C Screening  Never done   DTaP/Tdap/Td (1 - Tdap) Never done   Lung Cancer Screening  Never done   Zoster Vaccines- Shingrix (1 of 2) Never done   Colonoscopy  10/23/2019   INFLUENZA VACCINE  Never done   COVID-19 Vaccine (2 - 2024-25 season) 12/24/2022   HPV VACCINES  Aged Out      Review of Systems Patient reports no vision/hearing changes, anorexia, fever ,adenopathy, persistant/recurrent hoarseness, swallowing issues, chest pain, palpitations, edema, persistant/recurrent cough, hemoptysis, dyspnea (rest,exertional, paroxysmal nocturnal), gastrointestinal  bleeding (melena, rectal bleeding), abdominal pain, excessive heart burn, GU symptoms (dysuria, hematuria, voiding/incontinence issues) syncope, focal weakness, memory loss, numbness & tingling, skin/hair/nail changes, depression, anxiety, abnormal bruising/bleeding, musculoskeletal symptoms/signs.   + 7 lb weight loss + chronic constipation- having to eat 50g fiber daily just to have BM.  Takes colace daily since 2019.  Continues to have issues w/ large, hard stools causing pain and tearing    Objective:   Physical Exam General Appearance:    Alert, cooperative, no distress, appears stated age  Head:    Normocephalic, without obvious abnormality, atraumatic  Eyes:    PERRL, conjunctiva/corneas clear, EOM's intact both eyes       Ears:    Normal TM's and external ear canals, both ears  Nose:   Nares normal, septum midline, mucosa normal, no drainage   or sinus tenderness  Throat:    Lips, mucosa, and tongue normal; teeth and gums normal  Neck:   Supple, symmetrical, trachea midline, no adenopathy;       thyroid :  No enlargement/tenderness/nodules  Back:     Symmetric, no curvature, ROM normal, no CVA tenderness  Lungs:     Clear to auscultation bilaterally, respirations unlabored  Chest wall:    No tenderness or deformity  Heart:    Regular rate and rhythm, S1 and S2 normal, no murmur, rub   or gallop  Abdomen:     Soft, non-tender, bowel sounds active all four quadrants,    no masses, no organomegaly  Genitalia:    deferred  Rectal:    Extremities:   Extremities normal, atraumatic, no cyanosis or edema  Pulses:   2+ and symmetric all extremities  Skin:   Skin color, texture, turgor normal, no rashes or lesions  Lymph nodes:   Cervical, supraclavicular, and axillary nodes normal  Neurologic:   CNII-XII intact. Normal strength, sensation and reflexes      throughout          Assessment & Plan:

## 2023-05-03 NOTE — Assessment & Plan Note (Signed)
 Pt's PE WNL w/ exception of BMI.  UTD on colonoscopy.  Declines vaccines.  Check labs.  Anticipatory guidance provided.

## 2023-05-07 ENCOUNTER — Telehealth: Payer: Self-pay

## 2023-05-07 ENCOUNTER — Other Ambulatory Visit: Payer: Self-pay

## 2023-05-07 ENCOUNTER — Ambulatory Visit (INDEPENDENT_AMBULATORY_CARE_PROVIDER_SITE_OTHER): Payer: PRIVATE HEALTH INSURANCE

## 2023-05-07 DIAGNOSIS — R7309 Other abnormal glucose: Secondary | ICD-10-CM | POA: Diagnosis not present

## 2023-05-07 LAB — HEMOGLOBIN A1C: Hgb A1c MFr Bld: 5.4 % (ref 4.6–6.5)

## 2023-05-07 NOTE — Telephone Encounter (Signed)
 Pt has been notified.

## 2023-05-07 NOTE — Telephone Encounter (Signed)
 Pt has reviewed via Mychart Faxed lab add on to lab.

## 2023-05-07 NOTE — Telephone Encounter (Signed)
-----   Message from Neena Rhymes sent at 05/07/2023  4:30 PM EST ----- No evidence of diabetes- great news!

## 2023-05-07 NOTE — Telephone Encounter (Signed)
-----   Message from Comer Greet sent at 05/07/2023  7:43 AM EST ----- Labs look good w/ exception of elevated sugar.  We will add an A1C to assess for possible diabetes (although this is less likely bc you weren't fasting, and this is probably nothing to worry about)

## 2023-05-08 ENCOUNTER — Other Ambulatory Visit (HOSPITAL_COMMUNITY): Payer: Self-pay

## 2023-05-08 ENCOUNTER — Telehealth: Payer: Self-pay

## 2023-05-08 NOTE — Telephone Encounter (Signed)
 Pharmacy Patient Advocate Encounter   Received notification from CoverMyMeds that prior authorization for Linzess  capsules is required/requested.   Insurance verification completed.   The patient is insured through [facility] .   Per test claim: PA required; PA submitted to above mentioned insurance via CoverMyMeds Key/confirmation #/EOC [id number] Status is pending

## 2023-05-10 NOTE — Telephone Encounter (Signed)
Pt has been notified.

## 2023-05-10 NOTE — Telephone Encounter (Signed)
Pharmacy Patient Advocate Encounter  Received notification from [facility] that Prior Authorization for Linzess capsules has been DENIED.  See denial reason below. No denial letter attached in CMM. Will attach denial letter to Media tab once received.   PA #/Case ID/Reference #: [id number]  *A appeal is available if Dr wants to do that please let us know

## 2023-05-11 ENCOUNTER — Encounter: Payer: Self-pay | Admitting: Family Medicine

## 2023-05-14 ENCOUNTER — Telehealth: Payer: Self-pay

## 2023-05-14 NOTE — Telephone Encounter (Signed)
Sent to PA to PA team

## 2023-05-14 NOTE — Telephone Encounter (Signed)
Sent to PA team

## 2023-05-15 NOTE — Telephone Encounter (Signed)
Yes, there was new information given on chart. This is why provider requested new PA

## 2023-05-18 ENCOUNTER — Other Ambulatory Visit (HOSPITAL_COMMUNITY): Payer: Self-pay

## 2023-05-18 NOTE — Telephone Encounter (Signed)
Noted

## 2023-05-24 NOTE — Telephone Encounter (Signed)
Information has been sent to clinical pharmacist for appeals review. It may take 5-7 days to prepare the necessary documentation to request the appeal from the insurance.

## 2023-05-25 ENCOUNTER — Telehealth: Payer: Self-pay | Admitting: Pharmacist

## 2023-05-25 NOTE — Telephone Encounter (Signed)
Appeal has been submitted for Linzess. Will advise when response is received , please be advised that most companies may take 30 days to make a decision. Appeal letter and supporting documents have been faxed to [phone] on 05/25/2023 @4 :36pm.  Thank you, Dellie Burns, PharmD Clinical Pharmacist  [place]  Direct Dial: [phone]

## 2023-05-28 ENCOUNTER — Other Ambulatory Visit (HOSPITAL_COMMUNITY): Payer: Self-pay

## 2023-05-28 NOTE — Telephone Encounter (Signed)
Noted.  See other phone note.

## 2023-05-28 NOTE — Telephone Encounter (Signed)
[employer] has approved the appeal for Linzess.  A thirty day supply test billed through our [facility] showed a copay amount of $30.00.

## 2023-05-28 NOTE — Telephone Encounter (Signed)
Called patient to inform about this, no answer LM to call us back for further information

## 2023-05-29 NOTE — Telephone Encounter (Signed)
Spoke to patient and made him aware.

## 2023-06-22 ENCOUNTER — Other Ambulatory Visit: Payer: Self-pay | Admitting: Family Medicine

## 2023-06-27 ENCOUNTER — Telehealth: Payer: Self-pay | Admitting: Acute Care

## 2023-06-27 ENCOUNTER — Other Ambulatory Visit: Payer: Self-pay | Admitting: Emergency Medicine

## 2023-06-27 DIAGNOSIS — Z87891 Personal history of nicotine dependence: Secondary | ICD-10-CM

## 2023-06-27 DIAGNOSIS — Z122 Encounter for screening for malignant neoplasm of respiratory organs: Secondary | ICD-10-CM

## 2023-06-27 NOTE — Telephone Encounter (Signed)
 Lung Cancer Screening Narrative/Criteria Questionnaire (Cigarette Smokers Only- No Cigars/Pipes/vapes)   Thomas Proctor   SDMV:07/09/2023 at 11:15 with Thomas Proctor        06-01-1960   LDCT: 07/11/2023 at 1pm at GI    63 y.o.   Phone: [phone]  Lung Screening Narrative (confirm age 75-77 yrs Medicare / 50-80 yrs Private pay insurance)   Insurance information:[employer]   Referring Provider:Dr. Beverely Low   This screening involves an initial phone call with a team member from our program. It is called a shared decision making visit. The initial meeting is required by  insurance and Medicare to make sure you understand the program. This appointment takes about 15-20 minutes to complete. You will complete the screening scan at your scheduled date/time.  This scan takes about 5-10 minutes to complete. You can eat and drink normally before and after the scan.  Criteria questions for Lung Cancer Screening:   Are you a current or former smoker? Former Age began smoking: 63yo   If you are a former smoker, what year did you quit smoking? N/A(within 15 yrs)   To calculate your smoking history, I need an accurate estimate of how many packs of cigarettes you smoked per day and for how many years. (Not just the number of PPD you are now smoking)   Years smoking 44 x Packs per day 3/4 = Pack years 33   (at least 20 pack yrs)   (Make sure they understand that we need to know how much they have smoked in the past, not just the number of PPD they are smoking now)  Do you have a personal history of cancer?  No    Do you have a family history of cancer? Yes  (cancer type and and relative) Sister- lung  Are you coughing up blood?  No  Have you had unexplained weight loss of 15 lbs or more in the last 6 months? No  It looks like you meet all criteria.  When would be a good time for [place] to schedule you for this screening?   Additional information: N/A

## 2023-06-29 ENCOUNTER — Encounter: Payer: Self-pay | Admitting: Acute Care

## 2023-07-03 ENCOUNTER — Encounter: Payer: Self-pay | Admitting: Acute Care

## 2023-07-06 ENCOUNTER — Telehealth: Payer: Self-pay | Admitting: *Deleted

## 2023-07-06 NOTE — Telephone Encounter (Signed)
 Returned call. Advised the electronic form is a standard form that is signed on check in. Patient advises he did not like the wording and prefers to cancel [facility] and [facility]. States he will discuss with Dr. Beverely Low. Pt is not comfortable with students and 3rd parties having access to his information. Advised we can mail him a form to put restrictions on his release of information. Pt declined and cancelled appts. States he will call back if he changes mind.

## 2023-07-06 NOTE — Telephone Encounter (Signed)
 Returned call. Pt does not wish to cancel appt at this time. Advised he is concerned about consent form that he needed to sign prior to appt. Advised I will get more information and call him back.

## 2023-07-06 NOTE — Telephone Encounter (Signed)
 PT writes on [facility] he wishes to cancel his appt w/no remarks about a resched.

## 2023-07-09 ENCOUNTER — Encounter: Payer: PRIVATE HEALTH INSURANCE | Admitting: Adult Health

## 2023-07-11 ENCOUNTER — Other Ambulatory Visit: Payer: PRIVATE HEALTH INSURANCE

## 2023-07-13 NOTE — Telephone Encounter (Signed)
 NFN

## 2023-09-17 ENCOUNTER — Other Ambulatory Visit: Payer: Self-pay | Admitting: Family Medicine

## 2023-09-18 NOTE — Telephone Encounter (Signed)
 Requested Prescriptions   Pending Prescriptions Disp Refills   LINZESS  145 MCG CAPS capsule [Pharmacy Med Name: Linzess  145 MCG Oral Capsule] 30 capsule 0    Sig: TAKE 1 CAPSULE BY MOUTH ONCE DAILY BEFORE BREAKFAST   telmisartan  (MICARDIS ) 40 MG tablet [Pharmacy Med Name: Telmisartan  40 MG Oral Tablet] 90 tablet 0    Sig: Take 1 tablet by mouth once daily     Date of patient request: 09/18/2023 Last office visit: 05/03/2023 Upcoming visit: 10/31/2023 Date of last refill: 05/03/2023 Last refill amount: 30, 90

## 2023-10-15 ENCOUNTER — Other Ambulatory Visit: Payer: Self-pay | Admitting: Family Medicine

## 2023-10-31 ENCOUNTER — Ambulatory Visit: Payer: 59 | Admitting: Family Medicine

## 2023-11-14 ENCOUNTER — Ambulatory Visit: Admitting: Family Medicine

## 2023-11-24 ENCOUNTER — Other Ambulatory Visit: Payer: Self-pay | Admitting: Family Medicine

## 2023-12-14 ENCOUNTER — Encounter: Payer: Self-pay | Admitting: Family Medicine

## 2023-12-14 ENCOUNTER — Ambulatory Visit (INDEPENDENT_AMBULATORY_CARE_PROVIDER_SITE_OTHER): Payer: PRIVATE HEALTH INSURANCE | Admitting: Family Medicine

## 2023-12-14 VITALS — BP 110/64 | HR 69 | Temp 98.3°F | Resp 20 | Ht 69.0 in | Wt 237.1 lb

## 2023-12-14 DIAGNOSIS — E785 Hyperlipidemia, unspecified: Secondary | ICD-10-CM | POA: Diagnosis not present

## 2023-12-14 DIAGNOSIS — I10 Essential (primary) hypertension: Secondary | ICD-10-CM

## 2023-12-14 LAB — CBC WITH DIFFERENTIAL/PLATELET
Basophils Absolute: 0 K/uL (ref 0.0–0.1)
Basophils Relative: 0.4 % (ref 0.0–3.0)
Eosinophils Absolute: 0.1 K/uL (ref 0.0–0.7)
Eosinophils Relative: 1.2 % (ref 0.0–5.0)
HCT: 46.3 % (ref 39.0–52.0)
Hemoglobin: 15.6 g/dL (ref 13.0–17.0)
Lymphocytes Relative: 21.6 % (ref 12.0–46.0)
Lymphs Abs: 1.7 K/uL (ref 0.7–4.0)
MCHC: 33.8 g/dL (ref 30.0–36.0)
MCV: 88.2 fl (ref 78.0–100.0)
Monocytes Absolute: 0.9 K/uL (ref 0.1–1.0)
Monocytes Relative: 11.2 % (ref 3.0–12.0)
Neutro Abs: 5 K/uL (ref 1.4–7.7)
Neutrophils Relative %: 65.6 % (ref 43.0–77.0)
Platelets: 267 K/uL (ref 150.0–400.0)
RBC: 5.25 Mil/uL (ref 4.22–5.81)
RDW: 13.3 % (ref 11.5–15.5)
WBC: 7.7 K/uL (ref 4.0–10.5)

## 2023-12-14 LAB — HEPATIC FUNCTION PANEL
ALT: 15 U/L (ref 0–53)
AST: 17 U/L (ref 0–37)
Albumin: 4.9 g/dL (ref 3.5–5.2)
Alkaline Phosphatase: 57 U/L (ref 39–117)
Bilirubin, Direct: 0.2 mg/dL (ref 0.0–0.3)
Total Bilirubin: 1.1 mg/dL (ref 0.2–1.2)
Total Protein: 7.9 g/dL (ref 6.0–8.3)

## 2023-12-14 LAB — BASIC METABOLIC PANEL WITH GFR
BUN: 19 mg/dL (ref 6–23)
CO2: 25 meq/L (ref 19–32)
Calcium: 9.6 mg/dL (ref 8.4–10.5)
Chloride: 104 meq/L (ref 96–112)
Creatinine, Ser: 0.76 mg/dL (ref 0.40–1.50)
GFR: 94.82 mL/min (ref >60.00)
Glucose, Bld: 99 mg/dL (ref 70–99)
Potassium: 4.8 meq/L (ref 3.5–5.1)
Sodium: 139 meq/L (ref 135–145)

## 2023-12-14 LAB — LIPID PANEL
Cholesterol: 127 mg/dL (ref 0–200)
HDL: 34.4 mg/dL — ABNORMAL LOW (ref >39.00)
LDL Cholesterol: 69 mg/dL (ref 0–99)
NonHDL: 92.39
Total CHOL/HDL Ratio: 4
Triglycerides: 119 mg/dL (ref 0.0–149.0)
VLDL: 23.8 mg/dL (ref 0.0–40.0)

## 2023-12-14 LAB — TSH: TSH: 1.73 u[IU]/mL (ref 0.35–5.50)

## 2023-12-14 MED ORDER — AMLODIPINE BESYLATE 10 MG PO TABS: 10.0000 mg | ORAL_TABLET | Freq: Every day | ORAL | 1 refills | Status: AC

## 2023-12-14 MED ORDER — ATORVASTATIN CALCIUM 20 MG PO TABS: 20.0000 mg | ORAL_TABLET | Freq: Every day | ORAL | 1 refills | Status: AC

## 2023-12-14 MED ORDER — TELMISARTAN 40 MG PO TABS: 40.0000 mg | ORAL_TABLET | Freq: Every day | ORAL | 1 refills | Status: AC

## 2023-12-14 MED ORDER — LINACLOTIDE 145 MCG PO CAPS: 145.0000 ug | ORAL_CAPSULE | Freq: Every day | ORAL | 1 refills | Status: AC

## 2023-12-14 NOTE — Patient Instructions (Signed)
 Schedule your complete physical in 6 months We'll notify you of your lab results and make any changes if needed Keep up the good work on healthy diet and regular exercise- you look great! Call with any questions or concerns Stay Safe!  Stay Healthy! I'm SO proud of you!!!

## 2023-12-14 NOTE — Progress Notes (Signed)
   Subjective:    Patient ID: Thomas Proctor, male    DOB: 02/05/1961, 63 y.o.   MRN: [id number]  HPI HTN- chronic problem, on Amlodipine  10mg  daily, Telmisartan  40mg  daily w/ good control.  Denies CP, SOB, HA's, visual changes, edema.  Hyperlipidemia- chronic problem, on Lipitor 20mg  daily.  No abd pain, N/V.  Obesity- down 13 lbs since last visit.  BMI 35.  Pt has increased his physical activity  Declines Tdap and PNA.   Review of Systems For ROS see HPI     Objective:   Physical Exam Vitals reviewed.  Constitutional:      General: He is not in acute distress.    Appearance: Normal appearance. He is well-developed. He is not ill-appearing.  HENT:     Head: Normocephalic and atraumatic.  Eyes:     Extraocular Movements: Extraocular movements intact.     Conjunctiva/sclera: Conjunctivae normal.     Pupils: Pupils are equal, round, and reactive to light.  Neck:     Thyroid : No thyromegaly.  Cardiovascular:     Rate and Rhythm: Normal rate and regular rhythm.     Pulses: Normal pulses.     Heart sounds: Normal heart sounds. No murmur heard. Pulmonary:     Effort: Pulmonary effort is normal. No respiratory distress.     Breath sounds: Normal breath sounds.  Abdominal:     General: Bowel sounds are normal. There is no distension.     Palpations: Abdomen is soft.  Musculoskeletal:     Cervical back: Normal range of motion and neck supple.     Right lower leg: No edema.     Left lower leg: No edema.  Lymphadenopathy:     Cervical: No cervical adenopathy.  Skin:    General: Skin is warm and dry.  Neurological:     General: No focal deficit present.     Mental Status: He is alert and oriented to person, place, and time.     Cranial Nerves: No cranial nerve deficit.  Psychiatric:        Mood and Affect: Mood normal.        Behavior: Behavior normal.           Assessment & Plan:

## 2023-12-14 NOTE — Assessment & Plan Note (Signed)
 Improving!  Pt is down 13 lbs since last visit.  BMI now 35- which coupled w/ HTN and hyperlipidemia still qualifies as morbidly obese but he's so close to being <35.  Applauded his efforts.  Will continue to follow.

## 2023-12-14 NOTE — Assessment & Plan Note (Signed)
 Chronic problem.  On Amlodipine  and Telmisartan  w/ good control.  Currently asymptomatic.  Check labs due to ARB use but no anticipated med changes.  Will follow.

## 2023-12-14 NOTE — Assessment & Plan Note (Signed)
Chronic problem.  On Lipitor 20mg daily w/o difficulty.  Check labs.  Adjust meds prn  

## 2023-12-17 ENCOUNTER — Ambulatory Visit: Payer: Self-pay | Admitting: Family Medicine

## 2024-01-03 ENCOUNTER — Encounter: Payer: Self-pay | Admitting: Family Medicine

## 2024-06-17 ENCOUNTER — Encounter: Admitting: Family Medicine

## 2024-07-08 ENCOUNTER — Encounter: Admitting: Family Medicine
